# Patient Record
Sex: Female | Born: 1975 | Race: Black or African American | Hispanic: No | Marital: Married | Smoking: Never smoker
Health system: Southern US, Community
[De-identification: ages and names within clinical notes are randomized; demographics above are authoritative.]

## PROBLEM LIST (undated history)

## (undated) ENCOUNTER — Inpatient Hospital Stay (HOSPITAL_COMMUNITY): Payer: Self-pay

## (undated) DIAGNOSIS — A599 Trichomoniasis, unspecified: Secondary | ICD-10-CM

## (undated) DIAGNOSIS — Z8619 Personal history of other infectious and parasitic diseases: Secondary | ICD-10-CM

## (undated) DIAGNOSIS — IMO0002 Reserved for concepts with insufficient information to code with codable children: Secondary | ICD-10-CM

## (undated) DIAGNOSIS — K219 Gastro-esophageal reflux disease without esophagitis: Secondary | ICD-10-CM

## (undated) DIAGNOSIS — A630 Anogenital (venereal) warts: Secondary | ICD-10-CM

## (undated) HISTORY — DX: Personal history of other infectious and parasitic diseases: Z86.19

## (undated) HISTORY — DX: Trichomoniasis, unspecified: A59.9

## (undated) HISTORY — DX: Anogenital (venereal) warts: A63.0

## (undated) HISTORY — DX: Reserved for concepts with insufficient information to code with codable children: IMO0002

---

## 1999-10-07 ENCOUNTER — Other Ambulatory Visit: Admission: RE | Admit: 1999-10-07 | Discharge: 1999-10-07 | Payer: Self-pay | Admitting: Obstetrics and Gynecology

## 1999-11-05 ENCOUNTER — Other Ambulatory Visit: Admission: RE | Admit: 1999-11-05 | Discharge: 1999-11-05 | Payer: Self-pay | Admitting: *Deleted

## 2002-06-20 ENCOUNTER — Other Ambulatory Visit: Admission: RE | Admit: 2002-06-20 | Discharge: 2002-06-20 | Payer: Self-pay | Admitting: Obstetrics and Gynecology

## 2004-04-25 ENCOUNTER — Other Ambulatory Visit: Admission: RE | Admit: 2004-04-25 | Discharge: 2004-04-25 | Payer: Self-pay | Admitting: Obstetrics and Gynecology

## 2007-12-23 HISTORY — PX: OTHER SURGICAL HISTORY: SHX169

## 2009-12-22 DIAGNOSIS — R87619 Unspecified abnormal cytological findings in specimens from cervix uteri: Secondary | ICD-10-CM

## 2009-12-22 DIAGNOSIS — IMO0002 Reserved for concepts with insufficient information to code with codable children: Secondary | ICD-10-CM

## 2009-12-22 HISTORY — DX: Reserved for concepts with insufficient information to code with codable children: IMO0002

## 2009-12-22 HISTORY — DX: Unspecified abnormal cytological findings in specimens from cervix uteri: R87.619

## 2011-12-22 ENCOUNTER — Inpatient Hospital Stay (HOSPITAL_COMMUNITY): Admission: AD | Admit: 2011-12-22 | Payer: Self-pay | Source: Ambulatory Visit | Admitting: Obstetrics and Gynecology

## 2012-03-01 ENCOUNTER — Encounter (INDEPENDENT_AMBULATORY_CARE_PROVIDER_SITE_OTHER): Payer: Medicare HMO | Admitting: Obstetrics and Gynecology

## 2012-03-01 DIAGNOSIS — Z331 Pregnant state, incidental: Secondary | ICD-10-CM

## 2012-03-15 ENCOUNTER — Encounter (INDEPENDENT_AMBULATORY_CARE_PROVIDER_SITE_OTHER): Payer: Medicare HMO | Admitting: Obstetrics and Gynecology

## 2012-03-15 DIAGNOSIS — Z3480 Encounter for supervision of other normal pregnancy, unspecified trimester: Secondary | ICD-10-CM

## 2012-03-29 ENCOUNTER — Encounter (INDEPENDENT_AMBULATORY_CARE_PROVIDER_SITE_OTHER): Payer: Medicare HMO | Admitting: Obstetrics and Gynecology

## 2012-03-29 DIAGNOSIS — Z331 Pregnant state, incidental: Secondary | ICD-10-CM

## 2012-04-05 DIAGNOSIS — O09529 Supervision of elderly multigravida, unspecified trimester: Secondary | ICD-10-CM

## 2012-04-05 DIAGNOSIS — Z6837 Body mass index (BMI) 37.0-37.9, adult: Secondary | ICD-10-CM

## 2012-04-05 DIAGNOSIS — A491 Streptococcal infection, unspecified site: Secondary | ICD-10-CM

## 2012-04-06 ENCOUNTER — Ambulatory Visit (INDEPENDENT_AMBULATORY_CARE_PROVIDER_SITE_OTHER): Payer: Medicare HMO | Admitting: Obstetrics and Gynecology

## 2012-04-06 ENCOUNTER — Encounter: Payer: Self-pay | Admitting: Obstetrics and Gynecology

## 2012-04-06 VITALS — BP 106/64 | Ht 65.0 in | Wt 258.5 lb

## 2012-04-06 DIAGNOSIS — Z331 Pregnant state, incidental: Secondary | ICD-10-CM

## 2012-04-06 NOTE — Progress Notes (Signed)
Doing well. Cx: ftp/50%, -3. RTO 1 week. AVS

## 2012-04-13 ENCOUNTER — Ambulatory Visit (INDEPENDENT_AMBULATORY_CARE_PROVIDER_SITE_OTHER): Payer: Medicare HMO | Admitting: Obstetrics and Gynecology

## 2012-04-13 ENCOUNTER — Encounter: Payer: Self-pay | Admitting: Obstetrics and Gynecology

## 2012-04-13 VITALS — BP 114/72 | Ht 65.0 in | Wt 259.0 lb

## 2012-04-13 DIAGNOSIS — R000 Tachycardia, unspecified: Secondary | ICD-10-CM

## 2012-04-13 DIAGNOSIS — O368390 Maternal care for abnormalities of the fetal heart rate or rhythm, unspecified trimester, not applicable or unspecified: Secondary | ICD-10-CM

## 2012-04-13 DIAGNOSIS — Z331 Pregnant state, incidental: Secondary | ICD-10-CM

## 2012-04-13 NOTE — Progress Notes (Signed)
NST done for random FHR 170.  NST reactive.

## 2012-04-13 NOTE — Progress Notes (Signed)
Pt. Stated no issues today.  

## 2012-04-13 NOTE — Patient Instructions (Signed)
Continue fetal kick counts Labor precautions given

## 2012-04-21 ENCOUNTER — Encounter: Payer: Medicare HMO | Admitting: Obstetrics and Gynecology

## 2012-04-22 ENCOUNTER — Ambulatory Visit (INDEPENDENT_AMBULATORY_CARE_PROVIDER_SITE_OTHER): Payer: Medicare HMO | Admitting: Obstetrics and Gynecology

## 2012-04-22 ENCOUNTER — Telehealth (HOSPITAL_COMMUNITY): Payer: Self-pay | Admitting: *Deleted

## 2012-04-22 ENCOUNTER — Encounter: Payer: Self-pay | Admitting: Obstetrics and Gynecology

## 2012-04-22 VITALS — BP 112/72 | Wt 264.0 lb

## 2012-04-22 DIAGNOSIS — Z331 Pregnant state, incidental: Secondary | ICD-10-CM

## 2012-04-22 DIAGNOSIS — O09529 Supervision of elderly multigravida, unspecified trimester: Secondary | ICD-10-CM

## 2012-04-22 NOTE — Progress Notes (Signed)
No complaints

## 2012-04-22 NOTE — Progress Notes (Signed)
Pt. Stated no issues today the patient wants cervix check.

## 2012-04-26 ENCOUNTER — Encounter: Payer: Medicare HMO | Admitting: Obstetrics and Gynecology

## 2012-04-28 ENCOUNTER — Encounter: Payer: Self-pay | Admitting: Obstetrics and Gynecology

## 2012-04-28 ENCOUNTER — Ambulatory Visit (INDEPENDENT_AMBULATORY_CARE_PROVIDER_SITE_OTHER): Payer: Medicare HMO | Admitting: Obstetrics and Gynecology

## 2012-04-28 VITALS — BP 108/68 | Wt 267.0 lb

## 2012-04-28 DIAGNOSIS — Z331 Pregnant state, incidental: Secondary | ICD-10-CM

## 2012-04-28 NOTE — Progress Notes (Signed)
Pt c/o swelling in hands, feet, and lower legs.

## 2012-04-28 NOTE — Progress Notes (Signed)
A/P GBS positive Fetal kick counts reviewed Labor reviewed with pt All patients  questions answered 

## 2012-05-04 ENCOUNTER — Inpatient Hospital Stay (HOSPITAL_COMMUNITY)
Admission: AD | Admit: 2012-05-04 | Discharge: 2012-05-08 | DRG: 766 | Disposition: A | Discharge status: Home / Self Care | Payer: Managed Care, Other (non HMO) | Source: Ambulatory Visit | Attending: Obstetrics and Gynecology | Admitting: Obstetrics and Gynecology

## 2012-05-04 ENCOUNTER — Encounter (HOSPITAL_COMMUNITY): Payer: Self-pay | Admitting: *Deleted

## 2012-05-04 ENCOUNTER — Encounter: Payer: Medicare HMO | Admitting: Obstetrics and Gynecology

## 2012-05-04 DIAGNOSIS — Z905 Acquired absence of kidney: Secondary | ICD-10-CM

## 2012-05-04 DIAGNOSIS — O4290 Premature rupture of membranes, unspecified as to length of time between rupture and onset of labor, unspecified weeks of gestation: Secondary | ICD-10-CM | POA: Diagnosis present

## 2012-05-04 DIAGNOSIS — Z22330 Carrier of Group B streptococcus: Secondary | ICD-10-CM

## 2012-05-04 DIAGNOSIS — O368390 Maternal care for abnormalities of the fetal heart rate or rhythm, unspecified trimester, not applicable or unspecified: Secondary | ICD-10-CM | POA: Diagnosis not present

## 2012-05-04 DIAGNOSIS — Z98891 History of uterine scar from previous surgery: Secondary | ICD-10-CM | POA: Diagnosis not present

## 2012-05-04 DIAGNOSIS — O99892 Other specified diseases and conditions complicating childbirth: Secondary | ICD-10-CM | POA: Diagnosis present

## 2012-05-04 DIAGNOSIS — O09519 Supervision of elderly primigravida, unspecified trimester: Secondary | ICD-10-CM | POA: Diagnosis present

## 2012-05-04 LAB — CBC
HCT: 38.3 % (ref 36.0–46.0)
MCV: 87.8 fL (ref 78.0–100.0)
Platelets: 205 10*3/uL (ref 150–400)
RBC: 4.36 MIL/uL (ref 3.87–5.11)
WBC: 11.6 10*3/uL — ABNORMAL HIGH (ref 4.0–10.5)

## 2012-05-04 LAB — OB RESULTS CONSOLE HEPATITIS B SURFACE ANTIGEN: Hepatitis B Surface Ag: NEGATIVE

## 2012-05-04 LAB — OB RESULTS CONSOLE ABO/RH

## 2012-05-04 LAB — AMNISURE RUPTURE OF MEMBRANE (ROM) NOT AT ARMC: Amnisure ROM: POSITIVE

## 2012-05-04 LAB — OB RESULTS CONSOLE GC/CHLAMYDIA
Chlamydia: NEGATIVE
Gonorrhea: NEGATIVE

## 2012-05-04 LAB — OB RESULTS CONSOLE ANTIBODY SCREEN: Antibody Screen: NEGATIVE

## 2012-05-04 MED ORDER — LIDOCAINE HCL (PF) 1 % IJ SOLN: 30.0000 mL | INTRAMUSCULAR | Status: DC | PRN

## 2012-05-04 MED ORDER — OXYCODONE-ACETAMINOPHEN 5-325 MG PO TABS: 1.0000 | ORAL_TABLET | ORAL | Status: DC | PRN

## 2012-05-04 MED ORDER — SODIUM CHLORIDE 0.9 % IJ SOLN: 3.0000 mL | INTRAMUSCULAR | Status: DC | PRN

## 2012-05-04 MED ORDER — EPHEDRINE 5 MG/ML INJ: 10.0000 mg | INTRAVENOUS | Status: DC | PRN

## 2012-05-04 MED ORDER — HYDROXYZINE HCL 50 MG/ML IM SOLN: 50.0000 mg | Freq: Four times a day (QID) | INTRAMUSCULAR | Status: DC | PRN

## 2012-05-04 MED ORDER — OXYTOCIN 20 UNITS IN LACTATED RINGERS INFUSION - SIMPLE
1.0000 m[IU]/min | INTRAVENOUS | Status: DC
  Administered 2012-05-04: 2 m[IU]/min via INTRAVENOUS

## 2012-05-04 MED ORDER — FENTANYL 2.5 MCG/ML BUPIVACAINE 1/10 % EPIDURAL INFUSION (WH - ANES)
14.0000 mL/h | INTRAMUSCULAR | Status: DC
  Administered 2012-05-04 – 2012-05-05 (×2): 14 mL/h via EPIDURAL
  Filled 2012-05-04 (×3): qty 60

## 2012-05-04 MED ORDER — ONDANSETRON HCL 4 MG/2ML IJ SOLN: 4.0000 mg | Freq: Four times a day (QID) | INTRAMUSCULAR | Status: DC | PRN

## 2012-05-04 MED ORDER — PHENYLEPHRINE 40 MCG/ML (10ML) SYRINGE FOR IV PUSH (FOR BLOOD PRESSURE SUPPORT)
80.0000 ug | PREFILLED_SYRINGE | INTRAVENOUS | Status: DC | PRN
  Filled 2012-05-04: qty 2

## 2012-05-04 MED ORDER — PHENYLEPHRINE 40 MCG/ML (10ML) SYRINGE FOR IV PUSH (FOR BLOOD PRESSURE SUPPORT): 80.0000 ug | PREFILLED_SYRINGE | INTRAVENOUS | Status: DC | PRN

## 2012-05-04 MED ORDER — SODIUM CHLORIDE 0.9 % IJ SOLN: 3.0000 mL | Freq: Two times a day (BID) | INTRAMUSCULAR | Status: DC

## 2012-05-04 MED ORDER — FLEET ENEMA 7-19 GM/118ML RE ENEM: 1.0000 | ENEMA | RECTAL | Status: DC | PRN

## 2012-05-04 MED ORDER — ACETAMINOPHEN 325 MG PO TABS: 650.0000 mg | ORAL_TABLET | ORAL | Status: DC | PRN

## 2012-05-04 MED ORDER — HYDROXYZINE HCL 50 MG PO TABS: 50.0000 mg | ORAL_TABLET | Freq: Four times a day (QID) | ORAL | Status: DC | PRN

## 2012-05-04 MED ORDER — FENTANYL 2.5 MCG/ML BUPIVACAINE 1/10 % EPIDURAL INFUSION (WH - ANES): 14.0000 mL/h | INTRAMUSCULAR | Status: DC

## 2012-05-04 MED ORDER — PENICILLIN G POTASSIUM 5000000 UNITS IJ SOLR
5.0000 10*6.[IU] | Freq: Once | INTRAVENOUS | Status: AC
  Administered 2012-05-04: 5 10*6.[IU] via INTRAVENOUS
  Filled 2012-05-04: qty 5

## 2012-05-04 MED ORDER — OXYTOCIN 20 UNITS IN LACTATED RINGERS INFUSION - SIMPLE: 125.0000 mL/h | Freq: Once | INTRAVENOUS | Status: DC

## 2012-05-04 MED ORDER — SODIUM CHLORIDE 0.9 % IV SOLN: 250.0000 mL | INTRAVENOUS | Status: DC | PRN

## 2012-05-04 MED ORDER — LACTATED RINGERS IV SOLN
INTRAVENOUS | Status: DC
  Administered 2012-05-04: 300 mL/h via INTRAUTERINE

## 2012-05-04 MED ORDER — EPHEDRINE 5 MG/ML INJ
10.0000 mg | INTRAVENOUS | Status: DC | PRN
  Filled 2012-05-04: qty 2

## 2012-05-04 MED ORDER — EPHEDRINE 5 MG/ML INJ
10.0000 mg | INTRAVENOUS | Status: DC | PRN
  Filled 2012-05-04: qty 2
  Filled 2012-05-04: qty 4

## 2012-05-04 MED ORDER — FENTANYL 2.5 MCG/ML BUPIVACAINE 1/10 % EPIDURAL INFUSION (WH - ANES)
INTRAMUSCULAR | Status: DC | PRN
  Administered 2012-05-04: 14 mL/h via EPIDURAL

## 2012-05-04 MED ORDER — CITRIC ACID-SODIUM CITRATE 334-500 MG/5ML PO SOLN
30.0000 mL | ORAL | Status: DC | PRN
  Filled 2012-05-04: qty 15

## 2012-05-04 MED ORDER — PENICILLIN G POTASSIUM 5000000 UNITS IJ SOLR
2.5000 10*6.[IU] | INTRAMUSCULAR | Status: DC
  Administered 2012-05-04 (×5): 2.5 10*6.[IU] via INTRAVENOUS
  Filled 2012-05-04 (×9): qty 2.5

## 2012-05-04 MED ORDER — OXYTOCIN BOLUS FROM INFUSION
500.0000 mL | Freq: Once | INTRAVENOUS | Status: DC
  Filled 2012-05-04: qty 500
  Filled 2012-05-04: qty 1000

## 2012-05-04 MED ORDER — DIPHENHYDRAMINE HCL 50 MG/ML IJ SOLN: 12.5000 mg | INTRAMUSCULAR | Status: DC | PRN

## 2012-05-04 MED ORDER — LACTATED RINGERS IV SOLN
500.0000 mL | Freq: Once | INTRAVENOUS | Status: AC
  Administered 2012-05-04: 1000 mL via INTRAVENOUS
  Administered 2012-05-05: 04:00:00 via INTRAVENOUS

## 2012-05-04 MED ORDER — IBUPROFEN 600 MG PO TABS: 600.0000 mg | ORAL_TABLET | Freq: Four times a day (QID) | ORAL | Status: DC | PRN

## 2012-05-04 MED ORDER — BUTORPHANOL TARTRATE 2 MG/ML IJ SOLN: 1.0000 mg | INTRAMUSCULAR | Status: DC | PRN

## 2012-05-04 MED ORDER — LACTATED RINGERS IV SOLN: 500.0000 mL | Freq: Once | INTRAVENOUS | Status: DC

## 2012-05-04 MED ORDER — LIDOCAINE HCL (PF) 1 % IJ SOLN
INTRAMUSCULAR | Status: DC | PRN
  Administered 2012-05-04 (×2): 5 mL

## 2012-05-04 MED ORDER — PHENYLEPHRINE 40 MCG/ML (10ML) SYRINGE FOR IV PUSH (FOR BLOOD PRESSURE SUPPORT)
80.0000 ug | PREFILLED_SYRINGE | INTRAVENOUS | Status: DC | PRN
  Filled 2012-05-04: qty 5
  Filled 2012-05-04: qty 2

## 2012-05-04 MED ORDER — LACTATED RINGERS IV SOLN
INTRAVENOUS | Status: DC
  Administered 2012-05-04 – 2012-05-05 (×3): via INTRAVENOUS

## 2012-05-04 MED ORDER — LACTATED RINGERS IV SOLN
500.0000 mL | INTRAVENOUS | Status: DC | PRN
  Administered 2012-05-04: 500 mL via INTRAVENOUS

## 2012-05-04 MED ORDER — TERBUTALINE SULFATE 1 MG/ML IJ SOLN: 0.2500 mg | Freq: Once | INTRAMUSCULAR | Status: AC | PRN

## 2012-05-04 NOTE — Progress Notes (Signed)
Report received from A. Sherral Hammers, RN.  Hardie Shackleton, RN assume care.

## 2012-05-04 NOTE — Progress Notes (Signed)
  Subjective: Breathing with contractions, but declines need for pain medication at this time.    Objective: BP 135/85  Pulse 82  Temp(Src) 97.8 F (36.6 C) (Oral)  Resp 18  Ht 5\' 4"  (1.626 m)  Wt 269 lb (122.018 kg)  BMI 46.17 kg/m2  SpO2 100%      FHT:  Category 1--occasional mild variables UC:  q 4 min, mild/mod SVE:   Dilation: 2 Effacement (%): 90 Station: -2 Exam by:: [name]  Cervix still posterior, no membranes palpated.  Leaking clear fluid. Pitocin on 20 mu/min.  Assessment / Plan: PROM x 16 hours, latent phase labor Positive GBS  Plan: Continue current care.   Shelia Tucker 05/04/2012, 4:39 PM

## 2012-05-04 NOTE — Progress Notes (Signed)
RN to the bedside to assist patient to [facility] for void.

## 2012-05-04 NOTE — Anesthesia Preprocedure Evaluation (Addendum)
Anesthesia Evaluation  Patient identified by MRN, date of birth, ID band Patient awake    Reviewed: Allergy & Precautions, H&P , Patient's Chart, lab work & pertinent test results  Airway Mallampati: III TM Distance: >3 FB Neck ROM: full    Dental No notable dental hx.    Pulmonary neg pulmonary ROS,  breath sounds clear to auscultation  Pulmonary exam normal       Cardiovascular negative cardio ROS  Rhythm:regular Rate:Normal     Neuro/Psych negative neurological ROS  negative psych ROS   GI/Hepatic negative GI ROS, Neg liver ROS,   Endo/Other  negative endocrine ROS  Renal/GU negative Renal ROS     Musculoskeletal   Abdominal   Peds  Hematology negative hematology ROS (+)   Anesthesia Other Findings Abnormal Pap smear 2011 HPV positive Trichomonas        Condylomata acuminata in female     H/O candidiasis        History of varicella     Cystitis    Reproductive/Obstetrics (+) Pregnancy                           Anesthesia Physical Anesthesia Plan  ASA: III and Emergent  Anesthesia Plan: Epidural   Post-op Pain Management:    Induction:   Airway Management Planned:   Additional Equipment:   Intra-op Plan:   Post-operative Plan:   Informed Consent: I have reviewed the patients History and Physical, chart, labs and discussed the procedure including the risks, benefits and alternatives for the proposed anesthesia with the patient or authorized representative who has indicated his/her understanding and acceptance.     Plan Discussed with: CRNA, Anesthesiologist and Surgeon  Anesthesia Plan Comments:        Anesthesia Quick Evaluation

## 2012-05-04 NOTE — H&P (Signed)
Shelia Tucker is a 35y.o. Obese black female who presents for r/o ROM w/ CC of bloody d/c w/ watery consistency just before 2300 last night. No regular ctxs and no pain. C/o some pelvic "pressure." Has mainly seen d/c w/ wiping, but some superficially on a pad. No gushes of fluid. GFM.  Cx "about 1 cm" on her exam last week. No PIH or UTI s/s. No resp or GI c/o's.  Accompanied to MAU by her husband.  Prenatal Course: Pt started care at [facility] around 9 weeks; NOB urine cx did show positive GBS bacteriuria.  Her pregnancy has been overall uncomplicated.  She declined aneuploidy screens.  Had normal anatomy u/s w/ exception of a low-lying placenta at anatomy scan, but resolved on f/u at 22 weeks.  Did struggle w/ GERD during pregnancy, but treated w/ Zantac with good relief.  She did have increased weight gain during pregnancy beyond recommended amt for her elevated BMI.    Maternal Medical History:  Reason for admission: Reason for admission: rupture of membranes.  Contractions: Frequency: rare.    Fetal activity: Perceived fetal activity is normal.   Last perceived fetal movement was within the past hour.    Prenatal complications: 1.  AMA--declined genetic screens 2.  Unsure LMP--EDC by 9 week u/s 3.  GBS positive bacteriuria 4.  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  H/o low-lying placenta--resolved on f/u 6.  irreg cycles 7.  H/o abnl pap 8.  Remote h/o stds/condylomata     OB History    Grav Para Term Preterm Abortions TAB SAB Ect Mult Living   1 0 0 0 0 0 0 0 0 0      Past Medical History  Diagnosis Date  . 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 Trichomonas   . Condylomata acuminata in female   . H/O candidiasis   . History of varicella   . Cystitis    Past Surgical History  Procedure Date  . Donated kidney 2009   Family History: family history includes Heart disease in her maternal grandmother and Hypertension in her mother and paternal aunt. Social History:  reports  that she has never smoked. She has never used smokeless tobacco. 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  Her husband "Lyda Jester" has had some college and is involved and supportive.  Review of Systems  Constitutional: Negative.   HENT: Positive for congestion.        Nasal congestion and cough over this past weekend; improved since;   Eyes: Negative.   Respiratory: Positive for cough.        Nonproductive, mild cough over the weekend  Cardiovascular: Negative.   Gastrointestinal: Negative.   Genitourinary: Negative.   Skin: Negative.   Neurological: Negative.    Blood pressure 134/75, pulse 82, temperature 97.5 F (36.4 C), temperature source Oral, resp. rate 20, height 5\' 4"  (1.626 m), weight 269 lb (122.018 kg), SpO2 100.00%.   Maternal Exam:  Uterine Assessment: Contraction strength is mild.  Contraction frequency is rare.   Abdomen: Patient reports no abdominal tenderness. Estimated fetal weight is 7.5-8.5 lbs.   Fetal presentation: vertex  Introitus: Normal vulva. Vagina is positive for vaginal discharge.  Ferning test: negative.  Nitrazine test: not done. Amniotic fluid character: bloody.  Pelvis: adequate for delivery.   Cervix: Cervix evaluated by sterile speculum exam and digital exam.     Fetal Exam  Fetal Monitor Review: Mode: ultrasound.   Baseline rate: 135.  Variability: moderate (6-25 bpm).   Pattern: accelerations present and variable decelerations.    Fetal State Assessment: Category I - tracings are normal.     Physical Exam  Genitourinary: Vaginal discharge found.    Constitutional: She is oriented to person, place, and time. She appears well-developed and well-nourished. No distress.  Cardiovascular: Normal rate.  Respiratory: Effort normal.  GI: Soft.  gravid  Genitourinary:  SSE: Neg pooling; neg fern Mod amt of bloody show in vault (no active bleeding and not frank) Cx:  Ext os 1cm/ int os closed/30-40%/high/ant  Musculoskeletal: She exhibits edema.  Neurological: She is alert and oriented to person, place, and time.  Skin: Skin is warm and dry.   Prenatal labs: ABO, Rh: O/Positive/-- (05/14 0000) Antibody: Negative (05/14 0000) Rubella: Immune (05/14 0000) RPR: NON REACTIVE (05/14 0240)  HBsAg: Negative (05/14 0000)  HIV: Non-reactive (05/14 0000)  GBS: Positive (05/14 0000)  1hr gtt=69  Assessment/Plan: 1.  40.3 weeks 2.  PROM 3.  GBS pos 4.  amnisure positive 5.  Unfavorable cx  1.  Admit to [facility] w/ Dr. Estanislado Pandy as attending 2.  Rout L&D orders 3.  Start PCN-G now.  Offered expectant management vs Pitocin; R/B/A rev'd of each, and will watch for now, and if no regular ctxs by 7am, will begin pitocin low-dose per protocol 4.  C/w MD prn   Imogen Maddalena H 05/04/2012, 8:30 PM

## 2012-05-04 NOTE — Progress Notes (Signed)
  Subjective: Doing well--aware of contractions, some moderately painful.  Objective: BP 126/82  Pulse 77  Temp(Src) 98 F (36.7 C) (Oral)  Resp 18  Ht 5\' 4"  (1.626 m)  Wt 269 lb (122.018 kg)  BMI 46.17 kg/m2  SpO2 100%      FHT: Category 1--2 moderate variables with patient more on back, but tracing reactive before and after. UC:   q 3-4 min, pitocin on 18 mu/min  Assessment / Plan: PROM at 11:30pm 05/03/12 Augmentation with pitocin--early labor Postive GBS  Plan: Continue to observe.  [name] 05/04/2012, 2:50 PM

## 2012-05-04 NOTE — Progress Notes (Signed)
Called to report pt.'s status (labor progress); RN unable to trace contractions, pain level (1/10) from 16 mu of pitocin - instructed to continue increasing Pitocin - "we" will re-evaluate patient this evening. "Increase Pitocin as long as baby is OK." Asked pt. If she is feeling her contractions, pt. States, "I'm feeling a little cramping.  FOB remains at the bedside.

## 2012-05-04 NOTE — Progress Notes (Addendum)
Orders received to start amnio infusion and turn pitocin down to 15.

## 2012-05-04 NOTE — Anesthesia Procedure Notes (Signed)
Epidural Patient location during procedure: OB Start time: 05/04/2012 6:16 PM  Staffing Anesthesiologist: Brayton Caves R Performed by: anesthesiologist   Preanesthetic Checklist Completed: patient identified, site marked, surgical consent, pre-op evaluation, timeout performed, IV checked, risks and benefits discussed and monitors and equipment checked  Epidural Patient position: sitting Prep: site prepped and draped and DuraPrep Patient monitoring: continuous pulse ox and blood pressure Approach: midline Injection technique: LOR air and LOR saline  Needle:  Needle type: Tuohy  Needle gauge: 17 G Needle length: 9 cm Needle insertion depth: 9 cm Catheter type: closed end flexible Catheter size: 19 Gauge Catheter at skin depth: 15 cm Test dose: negative  Assessment Events: blood not aspirated, injection not painful, no injection resistance, negative IV test and no paresthesia  Additional Notes Patient identified.  Risk benefits discussed including failed block, incomplete pain control, headache, nerve damage, paralysis, blood pressure changes, nausea, vomiting, reactions to medication both toxic or allergic, and postpartum back pain.  Patient expressed understanding and wished to proceed.  All questions were answered.  Sterile technique used throughout procedure and epidural site dressed with sterile barrier dressing. No paresthesia or other complications noted.The patient did not experience any signs of intravascular injection such as tinnitus or metallic taste in mouth nor signs of intrathecal spread such as rapid motor block. Please see nursing notes for vital signs.

## 2012-05-04 NOTE — Progress Notes (Signed)
CNM on the phone and orders received to discontinue pitocin.

## 2012-05-04 NOTE — Progress Notes (Signed)
Subjective: Pt rechecked just before 2300, and although dilatation unchanged, fetal descent noted.  Pitocin is now off.  FHT still favors Rt sided positions.  Amnioinfusion continues.    Objective: BP 126/55  Pulse 66  Temp(Src) 98 F (36.7 C) (Oral)  Resp 18  Ht 5\' 4"  (1.626 m)  Wt 269 lb (122.018 kg)  BMI 46.17 kg/m2  SpO2 100%   Total I/O In: -  Out: 575 [Urine:575]  FHT:  FHR: 135 bpm, variability: moderate,  accelerations:  Abscent,  decelerations:  Present currently w/ recurring mild to mod variables UC:   irregular, every 2-5 minutes; some couplets SVE:   Dilation: 4 Effacement (%): 80 Station: -1 Exam by:: H. Deland Slocumb  Labs: Lab Results  Component Value Date   WBC 11.6* 05/04/2012   HGB 12.6 05/04/2012   HCT 38.3 05/04/2012   MCV 87.8 05/04/2012   PLT 205 05/04/2012    Assessment / Plan: 1.  40.3  2. PROM  3.  GBS pos  4.  Recurring decels since around 2100--stablized since Pitocin off and amnioinfusion started 5. Prolonged ROM  Labor: protracted latent phase Preeclampsia:  no signs or symptoms of toxicity Fetal Wellbeing:  Category II Pain Control:  Epidural I/D:  n/a Anticipated MOD:  NSVD 1.  Will cto FHT closely and for any s/s of infection 2.  MVU's are not adequate, so will try Pitocin prn if FHT WNL 3.  C/w MD prn Holden Draughon H 05/04/2012, 11:35 PM

## 2012-05-04 NOTE — Progress Notes (Signed)
  Subjective: Slept during night--aware of some mild cramping.  Leaking clear fluid.  Positive FM.  Objective: BP 120/76  Pulse 73  Temp(Src) 98 F (36.7 C) (Oral)  Resp 18  Ht 5\' 4"  (1.626 m)  Wt 269 lb (122.018 kg)  BMI 46.17 kg/m2  SpO2 100%      FHT:  Category 1 UC:   Occasional mild irritability SVE:   Dilation: 1 Effacement (%): 80 Station: -2 Exam by::  (Eriel Doyon) Cervix less posterior than previous exam by H. Steelman.  Labs: Lab Results  Component Value Date   WBC 11.6* 05/04/2012   HGB 12.6 05/04/2012   HCT 38.3 05/04/2012   MCV 87.8 05/04/2012   PLT 205 05/04/2012    Assessment / Plan: PROM prior to labor--SROM at 11:30pm 05/03/12. Positive GBS  Plan: Reviewed status with patient and husband, questions reviewed. Recommend pitocin augmentation--patient and husband agreeable. Discussed possibility of prolonged labor and need for further intervention (internal monitoring, C/S). Dr. Su Hilt updated.  [name] 05/04/2012, 8:46 AM

## 2012-05-04 NOTE — Progress Notes (Signed)
  Subjective: Doing well--comfortable with epidural.    Objective: BP 115/72  Pulse 84  Temp(Src) 98 F (36.7 C) (Oral)  Resp 18  Ht 5\' 4"  (1.626 m)  Wt 269 lb (122.018 kg)  BMI 46.17 kg/m2  SpO2 100%      FHT:  Category 1, occasional mild variable UC:   regular, every 3 minutes SVE:   Dilation: 3 Effacement (%): 90 Station: -2 Exam by:: Roselinda Bahena Cervix more anterior, forewaters noted. AROM--clear fluid. IUPC placed. Pitocin on 20 mu/min.   Assessment / Plan: PROM, positive GBS, augmentation with pitocin Early labor  Plan: Will continue to observe.   [name] 05/04/2012, 7:10 PM

## 2012-05-04 NOTE — Progress Notes (Signed)
Subjective: Pt comfortable.  Pitocin on 20mu.  NP friend and her husband remain at bedside.  RN reporting recent variables/lates; tried position changes and didn't tolerate Lt side very well.    Objective: BP 124/77  Pulse 71  Temp(Src) 98 F (36.7 C) (Oral)  Resp 20  Ht 5\' 4"  (1.626 m)  Wt 269 lb (122.018 kg)  BMI 46.17 kg/m2  SpO2 100%   Total I/O In: -  Out: 575 [Urine:575]  FHT:  FHR: 140 bpm, variability: moderate,  accelerations:  Present,  decelerations:  Present recurring variables/ some variables w/ late component since around 2100 UC:   regular, every 2-4 minutes some couplets SVE:   Dilation: 4 Effacement (%): 80 Station: -2 Exam by:: H. Bentlee Drier Mod bloody show noted; RN reports clear LOF (towel changed x2 since 1900) Labs: Lab Results  Component Value Date   WBC 11.6* 05/04/2012   HGB 12.6 05/04/2012   HCT 38.3 05/04/2012   MCV 87.8 05/04/2012   PLT 205 05/04/2012    Assessment / Plan: 1.  40.3  2.  PROM  3.  recurring decels last 1.5 hrs 4.  GBS pos; no s/s of infection  Labor: early active labor Preeclampsia:  no signs or symptoms of toxicity Fetal Wellbeing:  Category II Pain Control:  Epidural I/D:  n/a Anticipated MOD:  NSVD 1.  O2 on and decreased Pitocin in half to 10; start amnioinfusion 2.  C/w MD prn Mickaela Starlin H 05/04/2012, 10:25 PM

## 2012-05-04 NOTE — Progress Notes (Signed)
Patient ID: Shelia Tucker, female   DOB: May 18, 1976, 36 y.o.   MRN: [id number] .Subjective:  Denies any pain, occ mild cramping, had mucous bl tinged d/c recently  Objective: BP 87/65  Pulse 79  Temp(Src) 98 F (36.7 C) (Oral)  Resp 18  Ht 5\' 4"  (1.626 m)  Wt 269 lb (122.018 kg)  BMI 46.17 kg/m2  SpO2 100%   FHT:  FHR: 130 bpm, variability: moderate,  accelerations:  Present,  decelerations:  Present varibles just recently UC:   irregular, every 5-8 minutes SVE:   Dilation: 1 Effacement (%): 80 Station: -2 Exam by::  (latham)  Pitocin at 8mu  Assessment / Plan: IOL secondary to PROM GBS pos, has rcv'd PCN VE deferred   Fetal Wellbeing:  Category I Pain Control:  n/a  Update physician PRN  Malissa Hippo 05/04/2012, 10:42 AM

## 2012-05-04 NOTE — MAU Note (Signed)
Pt reports ?leaking fluid since 2330, fluid is slightly bloody per pt, states now it is just on the tissue when she wipes. Some mild cramping

## 2012-05-04 NOTE — Progress Notes (Signed)
Orders received to turn the pitocin down to 10 mu.

## 2012-05-05 ENCOUNTER — Encounter (HOSPITAL_COMMUNITY)
Admission: AD | Disposition: A | Discharge status: Home / Self Care | Payer: Self-pay | Source: Ambulatory Visit | Attending: Obstetrics and Gynecology

## 2012-05-05 ENCOUNTER — Encounter (HOSPITAL_COMMUNITY): Payer: Self-pay | Admitting: Anesthesiology

## 2012-05-05 ENCOUNTER — Inpatient Hospital Stay (HOSPITAL_COMMUNITY): Payer: Managed Care, Other (non HMO) | Admitting: Anesthesiology

## 2012-05-05 ENCOUNTER — Encounter (HOSPITAL_COMMUNITY): Payer: Self-pay | Admitting: Family Medicine

## 2012-05-05 SURGERY — Surgical Case
Anesthesia: Epidural

## 2012-05-05 MED ORDER — FENTANYL CITRATE 0.05 MG/ML IJ SOLN: 25.0000 ug | INTRAMUSCULAR | Status: DC | PRN

## 2012-05-05 MED ORDER — OXYTOCIN 10 UNIT/ML IJ SOLN
INTRAMUSCULAR | Status: DC | PRN
  Administered 2012-05-05: 20 [IU] via INTRAMUSCULAR

## 2012-05-05 MED ORDER — ONDANSETRON HCL 4 MG PO TABS: 4.0000 mg | ORAL_TABLET | ORAL | Status: DC | PRN

## 2012-05-05 MED ORDER — LACTATED RINGERS IV SOLN: INTRAVENOUS | Status: DC

## 2012-05-05 MED ORDER — IBUPROFEN 600 MG PO TABS: 600.0000 mg | ORAL_TABLET | Freq: Four times a day (QID) | ORAL | Status: DC | PRN

## 2012-05-05 MED ORDER — PHENYLEPHRINE HCL 10 MG/ML IJ SOLN
INTRAMUSCULAR | Status: DC | PRN
  Administered 2012-05-05: 120 ug via INTRAVENOUS
  Administered 2012-05-05 (×2): 40 ug via INTRAVENOUS
  Administered 2012-05-05 (×2): 80 ug via INTRAVENOUS

## 2012-05-05 MED ORDER — SODIUM CHLORIDE 0.9 % IV SOLN: 1.0000 ug/kg/h | INTRAVENOUS | Status: DC | PRN

## 2012-05-05 MED ORDER — MORPHINE SULFATE (PF) 0.5 MG/ML IJ SOLN
INTRAMUSCULAR | Status: DC | PRN
  Administered 2012-05-05: 4 mg via EPIDURAL

## 2012-05-05 MED ORDER — SODIUM BICARBONATE 8.4 % IV SOLN
INTRAVENOUS | Status: DC | PRN
  Administered 2012-05-05: 5 mL via EPIDURAL

## 2012-05-05 MED ORDER — SCOPOLAMINE 1 MG/3DAYS TD PT72
MEDICATED_PATCH | TRANSDERMAL | Status: AC
  Filled 2012-05-05: qty 1

## 2012-05-05 MED ORDER — WITCH HAZEL-GLYCERIN EX PADS: 1.0000 "application " | MEDICATED_PAD | CUTANEOUS | Status: DC | PRN

## 2012-05-05 MED ORDER — SODIUM CHLORIDE 0.9 % IJ SOLN: 3.0000 mL | INTRAMUSCULAR | Status: DC | PRN

## 2012-05-05 MED ORDER — MEPERIDINE HCL 25 MG/ML IJ SOLN: 6.2500 mg | INTRAMUSCULAR | Status: DC | PRN

## 2012-05-05 MED ORDER — ONDANSETRON HCL 4 MG/2ML IJ SOLN: 4.0000 mg | Freq: Three times a day (TID) | INTRAMUSCULAR | Status: DC | PRN

## 2012-05-05 MED ORDER — DIBUCAINE 1 % RE OINT
1.0000 "application " | TOPICAL_OINTMENT | RECTAL | Status: DC | PRN
  Filled 2012-05-05: qty 28

## 2012-05-05 MED ORDER — LANOLIN HYDROUS EX OINT: 1.0000 "application " | TOPICAL_OINTMENT | CUTANEOUS | Status: DC | PRN

## 2012-05-05 MED ORDER — ONDANSETRON HCL 4 MG/2ML IJ SOLN: 4.0000 mg | INTRAMUSCULAR | Status: DC | PRN

## 2012-05-05 MED ORDER — OXYCODONE-ACETAMINOPHEN 5-325 MG PO TABS: 1.0000 | ORAL_TABLET | ORAL | Status: DC | PRN

## 2012-05-05 MED ORDER — OXYTOCIN 10 UNIT/ML IJ SOLN
INTRAMUSCULAR | Status: AC
  Filled 2012-05-05: qty 2

## 2012-05-05 MED ORDER — DIPHENHYDRAMINE HCL 50 MG/ML IJ SOLN: 12.5000 mg | INTRAMUSCULAR | Status: DC | PRN

## 2012-05-05 MED ORDER — MORPHINE SULFATE (PF) 0.5 MG/ML IJ SOLN
INTRAMUSCULAR | Status: DC | PRN
  Administered 2012-05-05: .5 mg via EPIDURAL
  Administered 2012-05-05: .5 mg via INTRAVENOUS

## 2012-05-05 MED ORDER — MENTHOL 3 MG MT LOZG
1.0000 | LOZENGE | OROMUCOSAL | Status: DC | PRN
  Filled 2012-05-05: qty 9

## 2012-05-05 MED ORDER — NALBUPHINE HCL 10 MG/ML IJ SOLN
5.0000 mg | INTRAMUSCULAR | Status: DC | PRN
  Filled 2012-05-05: qty 1

## 2012-05-05 MED ORDER — ONDANSETRON HCL 4 MG/2ML IJ SOLN
INTRAMUSCULAR | Status: DC | PRN
  Administered 2012-05-05: 4 mg via INTRAVENOUS

## 2012-05-05 MED ORDER — METOCLOPRAMIDE HCL 5 MG/ML IJ SOLN
10.0000 mg | Freq: Three times a day (TID) | INTRAMUSCULAR | Status: DC | PRN
Start: 2012-05-05 — End: 2012-05-08

## 2012-05-05 MED ORDER — CEFAZOLIN SODIUM-DEXTROSE 2-3 GM-% IV SOLR
2.0000 g | Freq: Once | INTRAVENOUS | Status: AC
  Administered 2012-05-05: 2 g via INTRAVENOUS
  Filled 2012-05-05: qty 50

## 2012-05-05 MED ORDER — DIPHENHYDRAMINE HCL 25 MG PO CAPS
25.0000 mg | ORAL_CAPSULE | ORAL | Status: DC | PRN
  Filled 2012-05-05: qty 1

## 2012-05-05 MED ORDER — SENNOSIDES-DOCUSATE SODIUM 8.6-50 MG PO TABS
2.0000 | ORAL_TABLET | Freq: Every day | ORAL | Status: DC
  Administered 2012-05-06 – 2012-05-07 (×2): 2 via ORAL

## 2012-05-05 MED ORDER — PHENYLEPHRINE 40 MCG/ML (10ML) SYRINGE FOR IV PUSH (FOR BLOOD PRESSURE SUPPORT)
PREFILLED_SYRINGE | INTRAVENOUS | Status: AC
  Filled 2012-05-05: qty 10

## 2012-05-05 MED ORDER — SCOPOLAMINE 1 MG/3DAYS TD PT72
1.0000 | MEDICATED_PATCH | Freq: Once | TRANSDERMAL | Status: AC
  Administered 2012-05-05: 1.5 mg via TRANSDERMAL

## 2012-05-05 MED ORDER — DIPHENHYDRAMINE HCL 50 MG/ML IJ SOLN: 25.0000 mg | INTRAMUSCULAR | Status: DC | PRN

## 2012-05-05 MED ORDER — KETOROLAC TROMETHAMINE 30 MG/ML IJ SOLN: 30.0000 mg | Freq: Four times a day (QID) | INTRAMUSCULAR | Status: AC | PRN

## 2012-05-05 MED ORDER — ZOLPIDEM TARTRATE 5 MG PO TABS: 5.0000 mg | ORAL_TABLET | Freq: Every evening | ORAL | Status: DC | PRN

## 2012-05-05 MED ORDER — ONDANSETRON HCL 4 MG/2ML IJ SOLN
INTRAMUSCULAR | Status: AC
  Filled 2012-05-05: qty 2

## 2012-05-05 MED ORDER — DIPHENHYDRAMINE HCL 25 MG PO CAPS
25.0000 mg | ORAL_CAPSULE | Freq: Four times a day (QID) | ORAL | Status: DC | PRN
  Filled 2012-05-05: qty 1

## 2012-05-05 MED ORDER — KETOROLAC TROMETHAMINE 30 MG/ML IJ SOLN
INTRAMUSCULAR | Status: AC
  Filled 2012-05-05: qty 1

## 2012-05-05 MED ORDER — OXYTOCIN 20 UNITS IN LACTATED RINGERS INFUSION - SIMPLE
125.0000 mL/h | INTRAVENOUS | Status: AC
  Administered 2012-05-05: 125 mL/h via INTRAVENOUS
  Filled 2012-05-05: qty 1000

## 2012-05-05 MED ORDER — BUPIVACAINE HCL (PF) 0.25 % IJ SOLN: INTRAMUSCULAR | Status: DC | PRN

## 2012-05-05 MED ORDER — IBUPROFEN 600 MG PO TABS
600.0000 mg | ORAL_TABLET | Freq: Four times a day (QID) | ORAL | Status: DC
  Administered 2012-05-05 – 2012-05-08 (×10): 600 mg via ORAL
  Filled 2012-05-05 (×13): qty 1

## 2012-05-05 MED ORDER — TETANUS-DIPHTH-ACELL PERTUSSIS 5-2.5-18.5 LF-MCG/0.5 IM SUSP
0.5000 mL | Freq: Once | INTRAMUSCULAR | Status: AC
  Administered 2012-05-06: 0.5 mL via INTRAMUSCULAR
  Filled 2012-05-05 (×2): qty 0.5

## 2012-05-05 MED ORDER — SIMETHICONE 80 MG PO CHEW
80.0000 mg | CHEWABLE_TABLET | Freq: Three times a day (TID) | ORAL | Status: DC
  Administered 2012-05-05 – 2012-05-08 (×7): 80 mg via ORAL

## 2012-05-05 MED ORDER — NALOXONE HCL 0.4 MG/ML IJ SOLN: 0.4000 mg | INTRAMUSCULAR | Status: DC | PRN

## 2012-05-05 MED ORDER — SIMETHICONE 80 MG PO CHEW
80.0000 mg | CHEWABLE_TABLET | ORAL | Status: DC | PRN
  Administered 2012-05-06 (×2): 80 mg via ORAL

## 2012-05-05 MED ORDER — PRENATAL MULTIVITAMIN CH
1.0000 | ORAL_TABLET | Freq: Every day | ORAL | Status: DC
  Administered 2012-05-06 – 2012-05-08 (×3): 1 via ORAL
  Filled 2012-05-05 (×5): qty 1

## 2012-05-05 MED ORDER — LIDOCAINE-EPINEPHRINE (PF) 2 %-1:200000 IJ SOLN
INTRAMUSCULAR | Status: AC
  Filled 2012-05-05: qty 20

## 2012-05-05 MED ORDER — SODIUM BICARBONATE 8.4 % IV SOLN
INTRAVENOUS | Status: AC
  Filled 2012-05-05: qty 50

## 2012-05-05 MED ORDER — MORPHINE SULFATE 0.5 MG/ML IJ SOLN
INTRAMUSCULAR | Status: AC
  Filled 2012-05-05: qty 10

## 2012-05-05 MED ORDER — PHENYLEPHRINE 40 MCG/ML (10ML) SYRINGE FOR IV PUSH (FOR BLOOD PRESSURE SUPPORT)
PREFILLED_SYRINGE | INTRAVENOUS | Status: AC
  Filled 2012-05-05: qty 5

## 2012-05-05 MED ORDER — KETOROLAC TROMETHAMINE 30 MG/ML IJ SOLN
30.0000 mg | Freq: Four times a day (QID) | INTRAMUSCULAR | Status: AC | PRN
  Administered 2012-05-05: 30 mg via INTRAVENOUS

## 2012-05-05 SURGICAL SUPPLY — 34 items
BENZOIN TINCTURE PRP APPL 2/3 (GAUZE/BANDAGES/DRESSINGS) ×2 IMPLANT
CHLORAPREP W/TINT 26ML (MISCELLANEOUS) ×2 IMPLANT
CLOTH BEACON ORANGE TIMEOUT ST (SAFETY) ×2 IMPLANT
CONTAINER PREFILL 10% NBF 15ML (MISCELLANEOUS) IMPLANT
DRSG COVADERM 4X10 (GAUZE/BANDAGES/DRESSINGS) ×2 IMPLANT
ELECT REM PT RETURN 9FT ADLT (ELECTROSURGICAL) ×2
ELECTRODE REM PT RTRN 9FT ADLT (ELECTROSURGICAL) ×1 IMPLANT
EXTRACTOR VACUUM M CUP 4 TUBE (SUCTIONS) IMPLANT
GLOVE BIO SURGEON STRL SZ7.5 (GLOVE) ×4 IMPLANT
GLOVE BIOGEL PI IND STRL 7.5 (GLOVE) ×1 IMPLANT
GLOVE BIOGEL PI INDICATOR 7.5 (GLOVE) ×1
GLOVE ECLIPSE 7.5 STRL STRAW (GLOVE) ×2 IMPLANT
GLOVE SURG SS PI 7.5 STRL IVOR (GLOVE) ×4 IMPLANT
GOWN PREVENTION PLUS LG XLONG (DISPOSABLE) ×4 IMPLANT
KIT ABG SYR 3ML LUER SLIP (SYRINGE) IMPLANT
NEEDLE HYPO 22GX1.5 SAFETY (NEEDLE) IMPLANT
NEEDLE HYPO 25X5/8 SAFETYGLIDE (NEEDLE) IMPLANT
NS IRRIG 1000ML POUR BTL (IV SOLUTION) ×2 IMPLANT
PACK C SECTION WH (CUSTOM PROCEDURE TRAY) ×2 IMPLANT
RETRACTOR WND ALEXIS 25 LRG (MISCELLANEOUS) ×1 IMPLANT
RTRCTR WOUND ALEXIS 25CM LRG (MISCELLANEOUS) ×2
SLEEVE SCD COMPRESS KNEE MED (MISCELLANEOUS) IMPLANT
STRIP CLOSURE SKIN 1/2X4 (GAUZE/BANDAGES/DRESSINGS) ×2 IMPLANT
SUT CHROMIC 2 0 CT 1 (SUTURE) ×2 IMPLANT
SUT MNCRL AB 3-0 PS2 27 (SUTURE) ×2 IMPLANT
SUT PLAIN 0 NONE (SUTURE) IMPLANT
SUT PLAIN 2 0 XLH (SUTURE) ×2 IMPLANT
SUT VIC AB 0 CT1 36 (SUTURE) ×2 IMPLANT
SUT VIC AB 0 CTX 36 (SUTURE) ×4
SUT VIC AB 0 CTX36XBRD ANBCTRL (SUTURE) ×4 IMPLANT
SYR CONTROL 10ML LL (SYRINGE) IMPLANT
TOWEL OR 17X24 6PK STRL BLUE (TOWEL DISPOSABLE) ×4 IMPLANT
TRAY FOLEY CATH 14FR (SET/KITS/TRAYS/PACK) ×2 IMPLANT
WATER STERILE IRR 1000ML POUR (IV SOLUTION) ×2 IMPLANT

## 2012-05-05 NOTE — Progress Notes (Addendum)
Dr. Su Hilt and Eustace Pen at the bedside to discuss the risks and benefits of caesarean section delivery, pt states understanding. Informed consents singed.

## 2012-05-05 NOTE — Progress Notes (Signed)
Delivery call to OR 2.

## 2012-05-05 NOTE — Addendum Note (Signed)
Addendum  created 05/05/12 0940 by Shanon Payor, CRNA   Modules edited:Notes Section

## 2012-05-05 NOTE — Progress Notes (Signed)
Subjective: Called to pt's bedside for decelerations at 0211.  Pitocin restarted at 0045 at 1mu, and RN had discontinued again around the time she called me to bedside.  Pt has had oxygen on.  RN rechecked cx and no change.    Objective: BP 122/66  Pulse 77  Temp(Src) 97.8 F (36.6 C) (Axillary)  Resp 20  Ht 5\' 4"  (1.626 m)  Wt 269 lb (122.018 kg)  BMI 46.17 kg/m2  SpO2 100%   Total I/O In: -  Out: 575 [Urine:575]  FHT:  FHR: 135 bpm, variability: moderate,  accelerations:  Abscent,  decelerations:  Present recurrent mild to mod variables despite interventions UC:   regular, every 2-3 minutes; ctxs had spaced while pitocin off to q 2-5 min SVE:   Dilation: 3.5 Effacement (%): 80 Station: -1 Exam by:: Everrett Coombe RN  Labs: Lab Results  Component Value Date   WBC 11.6* 05/04/2012   HGB 12.6 05/04/2012   HCT 38.3 05/04/2012   MCV 87.8 05/04/2012   PLT 205 05/04/2012    Assessment / Plan: 1.  40.4  2.  Prolonged ROM  3.  GBS pos  4.  No cervical change, but inadequate MVU's 5.  Recurrent decels  Labor: protracted latent phase Preeclampsia:  no signs or symptoms of toxicity Fetal Wellbeing:  Category II Pain Control:  Epidural I/D:  n/a Anticipated MOD:  Probable need for c/s discussed with pt and her s.o. secondary to NR FHT and inability to titrate Pitocin to achieve adequacy b/c of FHT 1.  Dr. Su Hilt notified 2.  Will continue to observe closely, and if no change over next hr, will proceed w/ c/s for delivery Aaran Enberg H 05/05/2012, 2:54 AM

## 2012-05-05 NOTE — Transfer of Care (Signed)
Immediate Anesthesia Transfer of Care Note  Patient: Shelia Tucker  Procedure(s) Performed: Procedure(s) (LRB): CESAREAN SECTION (N/A)  Patient Location: PACU  Anesthesia Type: Epidural  Level of Consciousness: sedated and patient cooperative  Airway & Oxygen Therapy: Patient Spontanous Breathing  Post-op Assessment: Report given to PACU RN and Post -op Vital signs reviewed and stable  Post vital signs: Reviewed and stable  Complications: No apparent anesthesia complications

## 2012-05-05 NOTE — Anesthesia Postprocedure Evaluation (Signed)
  Anesthesia Post-op Note  Patient: Shelia Tucker, Shelia Tucker  Procedure(s) Performed: Procedure(s) (LRB): CESAREAN SECTION (N/A)  Patient Location: PACU  Anesthesia Type: Epidural  Level of Consciousness: awake, alert  and oriented  Airway and Oxygen Therapy: Patient Spontanous Breathing  Post-op Pain: none  Post-op Assessment: Post-op Vital signs reviewed, Patient's Cardiovascular Status Stable, Respiratory Function Stable, Patent Airway, No signs of Nausea or vomiting, Pain level controlled, No headache and No backache  Post-op Vital Signs: Reviewed and stable  Complications: No apparent anesthesia complications

## 2012-05-05 NOTE — Op Note (Signed)
Cesarean Section Procedure Note  Indications: 1.Term IUP 2.NRFHT unable to tolerate pitocin  Pre-operative Diagnosis: Non-Reassuring Fetal Heart Rate   Post-operative Diagnosis: Non-Reassuring Fetal Heart Rate  Procedure: CESAREAN SECTION  Surgeon: Osborn Coho, MD   Assistants: Denny Levy, CNM  Anesthesia: Regional  Anesthesiologist: Tyrone Apple. Malen Gauze, MD   Procedure Details  The patient was taken to the operating room secondary to NRFHT after the risks, benefits, complications, treatment options, and expected outcomes were discussed with the patient.  The patient concurred with the proposed plan, giving informed consent which was signed and witnessed. The patient was taken to Operating Room 2, identified as Shelia Tucker and the procedure verified as C-Section Delivery. A Time Out was held and the above information confirmed.  After induction of anesthesia by obtained a surgical level via the epidural, the patient was prepped and draped in the usual sterile manner. A Pfannenstiel skin incision was made and carried down through the subcutaneous tissue to the underlying layer of fascia.  The fascia was incised bilaterally and extended transversely bilaterally with the Mayo scissors. Kocher clamps were placed on the inferior aspect of the fascial incision and the underlying rectus muscle was separated from the fascia. The same was done on the superior aspect of the fascial incision.  The peritoneum was identified, entered bluntly and extended manually. The utero-vesical peritoneal reflection was incised transversely and the bladder flap was bluntly freed from the lower uterine segment. A low transverse uterine incision was made with the scalpel and extended bilaterally with the bandage scissors.  The infant was delivered in vertex position without difficulty.  After the umbilical cord was clamped and cut, the infant was handed to the awaiting pediatricians.  Cord blood was obtained for  evaluation.  The placenta was removed intact and appeared to be within normal limits. The uterus was cleared of all clots and debris. The uterine incision was closed with running interlocking sutures of 0 Vicryl and a second imbricating layer was performed as well.   Bilateral tubes and ovaries appeared to be within normal limits.  Good hemostasis was noted.  Copious irrigation was performed until clear.  The peritoneum was repaired with 2-0 chromic via a running suture.  The fascia was reapproximated with a running suture of 0 Vicryl. The subcutaneous tissue was reapproximated with 3 interrupted sutures of 2-0 plain.  The skin was reapproximated with a subcuticular suture of 3-0 monocryl.  Steristrips were applied.  Instrument, sponge, and needle counts were correct prior to abdominal closure and at the conclusion of the case.  The patient was awaiting transfer to the recovery room in good condition.  Findings: Live female infant with Apgars 9 at one minute and 9 at five minutes.  Normal appearing bilateral ovaries and fallopian tubes were noted.   Estimated Blood Loss:          Drains: foley to gravity         Total IV Fluids:         Specimens to Pathology: Placenta         Complications:  None; patient tolerated the procedure well.         Disposition: PACU - hemodynamically stable.         Condition: stable  Attending Attestation: I performed the procedure.

## 2012-05-05 NOTE — OR Nursing (Deleted)
Unable to obtain cord blood per Dr. Su Hilt

## 2012-05-05 NOTE — Anesthesia Postprocedure Evaluation (Signed)
  Anesthesia Post-op Note  Patient: Designer, industrial/product  Procedure(s) Performed: Procedure(s) (LRB): CESAREAN SECTION (N/A)  Patient Location: Mother/Baby  Anesthesia Type: Epidural  Level of Consciousness: awake, alert  and oriented  Airway and Oxygen Therapy: Patient Spontanous Breathing  Post-op Pain: none  Post-op Assessment: Post-op Vital signs reviewed, Patient's Cardiovascular Status Stable, No headache, No backache, No residual numbness and No residual motor weakness  Post-op Vital Signs: Reviewed and stable  Complications: No apparent anesthesia complications

## 2012-05-06 ENCOUNTER — Encounter (HOSPITAL_COMMUNITY): Payer: Self-pay | Admitting: Obstetrics and Gynecology

## 2012-05-06 LAB — CBC
Platelets: 175 10*3/uL (ref 150–400)
RDW: 14.3 % (ref 11.5–15.5)
WBC: 15.2 10*3/uL — ABNORMAL HIGH (ref 4.0–10.5)

## 2012-05-06 NOTE — Progress Notes (Signed)
Patient ID: Shelia Tucker, female   DOB: Sep 03, 1976, 36 y.o.   MRN: [id number] Subjective: Postpartum Day 1: Cesarean Delivery Patient reports tolerating PO and no problems voiding. No BM, no flatus  no complaints, up ad lib without syncope Pain well controlled with po meds BF initiated, baby fussy Mood stable, bonding well   Objective: Vital signs in last 24 hours: Temp:  [97 F (36.1 C)-98.7 F (37.1 C)] 98.3 F (36.8 C) (05/16 0603) Pulse Rate:  [61-80] 66  (05/16 0603) Resp:  [13-18] 18  (05/16 0603) BP: (102-148)/(65-96) 107/69 mmHg (05/16 0603) SpO2:  [98 %-100 %] 99 % (05/16 0345)  Physical Exam:  General: alert and no distress Breasts: soft, nipples intact Heart: RRR Lungs: CTAB Abdomen: BS x4 Uterine Fundus: firm Incision: CDI Lochia: appropriate DVT Evaluation: No evidence of DVT seen on physical exam. Negative Homan's sign. Calf/Ankle edema is present.   Basename 05/06/12 0500 05/04/12 0240  HGB 9.3* 12.6  HCT 28.8* 38.3    Assessment/Plan: Status post Cesarean section. Doing well postoperatively.  Anemia, hemodynamically stable Mild leukocytosis, no s/s infection Continue current care.  Nattalie Santiesteban M 05/06/2012, 8:22 AM

## 2012-05-07 ENCOUNTER — Encounter: Payer: Medicare HMO | Admitting: Obstetrics and Gynecology

## 2012-05-07 NOTE — Progress Notes (Signed)
Subjective: Postpartum Day 2Cesarean Delivery Patient reports no  nausea, vomiting, tolerating PO, + flatus and no problems voiding.   BP 114/78  Pulse 68  Temp(Src) 98.3 F (36.8 C) (Oral)  Resp 18  Ht 5\' 4"  (1.626 m)  Wt 269 lb (122.018 kg)  BMI 46.17 kg/m2  SpO2 99%  Breastfeeding? Unknown  Physical Exam:  General: alert, cooperative and no distress Lochia: appropriate Uterine Fundus: firm Incision: healing well, no significant drainage, no significant erythema DVT Evaluation: Negative Homan's sign. No significant calf/ankle edema. Lungs clear bilaterally AP RRR Bowel sounds active abd soft rounded nt,  S: comfortable, little bleeding, slept     feeding A normal involution     Lactating     PO day 2     Normal involution P continue care Lavera Guise, CNM

## 2012-05-08 DIAGNOSIS — Z98891 History of uterine scar from previous surgery: Secondary | ICD-10-CM | POA: Diagnosis not present

## 2012-05-08 MED ORDER — IBUPROFEN 600 MG PO TABS: 600.0000 mg | ORAL_TABLET | Freq: Four times a day (QID) | ORAL | Status: AC | PRN

## 2012-05-08 MED ORDER — OXYCODONE-ACETAMINOPHEN 5-325 MG PO TABS: 1.0000 | ORAL_TABLET | ORAL | Status: AC | PRN

## 2012-05-08 NOTE — Discharge Instructions (Signed)
Cesarean Delivery  °Cesarean delivery is the birth of a baby through a cut (incision) in the abdomen and womb (uterus).  °LET YOUR CAREGIVER KNOW ABOUT: °· Complications involving the pregnancy.  °· Allergies.  °· Medicines taken including herbs, eyedrops, over-the-counter medicines, and creams.  °· Use of steroids (by mouth or creams).  °· Previous problems with anesthetics or numbing medicine.  °· Previous surgery.  °· History of blood clots.  °· History of bleeding or blood problems.  °· Other health problems.  °RISKS AND COMPLICATIONS  °· Bleeding.  °· Infection.  °· Blood clots.  °· Injury to surrounding organs.  °· Anesthesia problems.  °· Injury to the baby.  °BEFORE THE PROCEDURE  °· A tube (Foley catheter) will be placed in your bladder. The Foley catheter drains the urine from your bladder into a bag. This keeps your bladder empty during surgery.  °· An intravenous access tube (IV) will be placed in your arm.  °· Hair may be removed from your pubic area and your lower abdomen. This is to prevent infection in the incision site.  °· You may be given an antacid medicine to drink. This will prevent acid contents in your stomach from going into your lungs if you vomit during the surgery.  °· You may be given an antibiotic medicine to prevent infection.  °PROCEDURE  °· You may be given medicine to numb the lower half of your body (regional anesthetic). If you were in labor, you may have already had an epidural in place which can be used in both labor and cesarean delivery. You may possibly be given medicine to make you sleep (general anesthetic) though this is not as common.  °· An incision will be made in your abdomen that extends to your uterus. There are 2 basic kinds of incisions:  °· The horizontal (transverse) incision. Horizontal incisions are used for most routine cesarean deliveries.  °· The vertical (up and down) incision. This is less commonly used. This is most often reserved for women who have a  serious complication (extreme prematurity) or under emergency situations.   °· The horizontal and vertical incisions may both be used at the same time. However, this is very uncommon.  °· Your baby will then be delivered.  °AFTER THE PROCEDURE  °· If you were awake during the surgery, you will see your baby right away. If you were asleep, you will see your baby as soon as you are awake.  °· You may breastfeed your baby after surgery.  °· You may be able to get up and walk the same day as the surgery. If you need to stay in bed for a period of time, you will receive help to turn, cough, and take deep breaths after surgery. This helps prevent lung problems such as pneumonia.  °· Do not get out of bed alone the first time after surgery. You will need help getting out of bed until you are able to do this by yourself.  °· You may be able to shower the day after your cesarean delivery.  After the bandage (dressing) is taken off the incision site, a nurse will assist you to shower, if you like.   °· You will have pneumatic compressing hose placed on your feet or lower legs. These hose are used to prevent blood clots. When you are up and walking regularly, they will no longer be necessary.   °· Do not cross your legs when you sit.  °· Save any blood clots that you   pass. If you pass a clot while on the toilet, do not flush it. Call for the nurse. Tell the nurse if you think you are bleeding too much or passing too many clots.   Start drinking liquids and eating food as directed by your caregiver. If your stomach is not ready, drinking and eating too soon can cause an increase in bloating and swelling of your intestine and abdomen. This is very uncomfortable.   You will be given medicine as needed. Let your caregivers know if you are hurting. They want you to be comfortable. You may also be given an antibiotic to prevent an infection.   Your IV will be taken out when you are drinking a reasonable amount of fluids. The  Foley catheter is taken out when you are up and walking.   If your blood type is Rh negative and your baby's blood type is Rh positive, you will be given a shot of anti-D immune globulin. This shot prevents you from having Rh problems with a future pregnancy. You should get the shot even if you had your tubes tied (tubal ligation).   If you are allowed to take the baby for a walk, place the baby in the bassinet and push it. Do not carry your baby in your arms.  Document Released: 12/08/2005 Document Revised: 11/27/2011 Document Reviewed: 04/04/2011 [facility] Patient Information 2012 [place], [place]. Breastfeeding BENEFITS OF BREASTFEEDING For the baby  The first milk (colostrum) helps the baby's digestive system function better.   There are antibodies from the mother in the milk that help the baby fight off infections.   The baby has a lower incidence of asthma, allergies, and SIDS (sudden infant death syndrome).   The nutrients in breast milk are better than formulas for the baby and helps the baby's brain grow better.   Babies who breastfeed have less gas, colic, and constipation.  For the mother  Breastfeeding helps develop a very special bond between mother and baby.   It is more convenient, always available at the correct temperature and cheaper than formula feeding.   It burns calories in the mother and helps with losing weight that was gained during pregnancy.   It makes the uterus contract back down to normal size faster and slows bleeding following delivery.   Breastfeeding mothers have a lower risk of developing breast cancer.  NURSE FREQUENTLY  A healthy, full-term baby may breastfeed as often as every hour or space his or her feedings to every 3 hours.   How often to nurse will vary from baby to baby. Watch your baby for signs of hunger, not the clock.   Nurse as often as the baby requests, or when you feel the need to reduce the fullness of your breasts.   Awaken  the baby if it has been 3 to 4 hours since the last feeding.   Frequent feeding will help the mother make more milk and will prevent problems like sore nipples and engorgement of the breasts.  BABY'S POSITION AT THE BREAST  Whether lying down or sitting, be sure that the baby's tummy is facing your tummy.   Support the breast with 4 fingers underneath the breast and the thumb above. Make sure your fingers are well away from the nipple and baby's mouth.   Stroke the baby's lips and cheek closest to the breast gently with your finger or nipple.   When the baby's mouth is open wide enough, place all of your nipple and as  much of the dark area around the nipple as possible into your baby's mouth.   Pull the baby in close so the tip of the nose and the baby's cheeks touch the breast during the feeding.  FEEDINGS  The length of each feeding varies from baby to baby and from feeding to feeding.   The baby must suck about 2 to 3 minutes for your milk to get to him or her. This is called a "let down." For this reason, allow the baby to feed on each breast as long as he or she wants. Your baby will end the feeding when he or she has received the right balance of nutrients.   To break the suction, put your finger into the corner of the baby's mouth and slide it between his or her gums before removing your breast from his or her mouth. This will help prevent sore nipples.  REDUCING BREAST ENGORGEMENT  In the first week after your baby is born, you may experience signs of breast engorgement. When breasts are engorged, they feel heavy, warm, full, and may be tender to the touch. You can reduce engorgement if you:   Nurse frequently, every 2 to 3 hours. Mothers who breastfeed early and often have fewer problems with engorgement.   Place light ice packs on your breasts between feedings. This reduces swelling. Wrap the ice packs in a lightweight towel to protect your skin.   Apply moist hot packs to your  breast for 5 to 10 minutes before each feeding. This increases circulation and helps the milk flow.   Gently massage your breast before and during the feeding.   Make sure that the baby empties at least one breast at every feeding before switching sides.   Use a breast pump to empty the breasts if your baby is sleepy or not nursing well. You may also want to pump if you are returning to work or or you feel you are getting engorged.   Avoid bottle feeds, pacifiers or supplemental feedings of water or juice in place of breastfeeding.   Be sure the baby is latched on and positioned properly while breastfeeding.   Prevent fatigue, stress, and anemia.   Wear a supportive bra, avoiding underwire styles.   Eat a balanced diet with enough fluids.  If you follow these suggestions, your engorgement should improve in 24 to 48 hours. If you are still experiencing difficulty, call your lactation consultant or caregiver. IS MY BABY GETTING ENOUGH MILK? Sometimes, mothers worry about whether their babies are getting enough milk. You can be assured that your baby is getting enough milk if:  The baby is actively sucking and you hear swallowing.   The baby nurses at least 8 to 12 times in a 24 hour time period. Nurse your baby until he or she unlatches or falls asleep at the first breast (at least 10 to 20 minutes), then offer the second side.   The baby is wetting 5 to 6 disposable diapers (6 to 8 cloth diapers) in a 24 hour period by 93 to 54 days of age.   The baby is having at least 2 to 3 stools every 24 hours for the first few months. Breast milk is all the food your baby needs. It is not necessary for your baby to have water or formula. In fact, to help your breasts make more milk, it is best not to give your baby supplemental feedings during the early weeks.   The stool should  be soft and yellow.   The baby should gain 4 to 7 ounces per week after he is 87 days old.  TAKE CARE OF YOURSELF Take  care of your breasts by:  Bathing or showering daily.   Avoiding the use of soaps on your nipples.   Start feedings on your left breast at one feeding and on your right breast at the next feeding.   You will notice an increase in your milk supply 2 to 5 days after delivery. You may feel some discomfort from engorgement, which makes your breasts very firm and often tender. Engorgement "peaks" out within 24 to 48 hours. In the meantime, apply warm moist towels to your breasts for 5 to 10 minutes before feeding. Gentle massage and expression of some milk before feeding will soften your breasts, making it easier for your baby to latch on. Wear a well fitting nursing bra and air dry your nipples for 10 to 15 minutes after each feeding.   Only use cotton bra pads.   Only use pure lanolin on your nipples after nursing. You do not need to wash it off before nursing.  Take care of yourself by:   Eating well-balanced meals and nutritious snacks.   Drinking milk, fruit juice, and water to satisfy your thirst (about 8 glasses a day).   Getting plenty of rest.   Increasing calcium in your diet (1200 mg a day).   Avoiding foods that you notice affect the baby in a bad way.  SEEK MEDICAL CARE IF:   You have any questions or difficulty with breastfeeding.   You need help.   You have a hard, red, sore area on your breast, accompanied by a fever of 100.5 F (38.1 C) or more.   Your baby is too sleepy to eat well or is having trouble sleeping.   Your baby is wetting less than 6 diapers per day, by 81 days of age.   Your baby's skin or white part of his or her eyes is more yellow than it was in the hospital.   You feel depressed.  Document Released: 12/08/2005 Document Revised: 11/27/2011 Document Reviewed: 07/23/2009 [facility] Patient Information 2012 [place], [place].

## 2012-05-08 NOTE — Discharge Summary (Signed)
Physician Discharge Summary  Patient ID: Shelia Tucker MRN: [id number] DOB/AGE: [birth year] 36 y.o.  Admit date: 05/04/2012 Discharge date: 05/08/2012  Admission Diagnoses:term pg, PPROM  Discharge Diagnoses:  Principal Problem:  *Status post repeat low transverse cesarean section Active Problems:  Single kidney  PROM (premature rupture of membranes)   Discharged Condition: stable  Hospital Course: term pg, PPROM, FTP C/S  Consults: None  Significant Diagnostic Studies: labs:  Results for orders placed during the hospital encounter of 05/04/12 (from the past 72 hour(s))  CBC     Status: Abnormal   Collection Time   05/06/12  5:00 AM      Component Value Range Comment   WBC 15.2 (*) 4.0 - 10.5 (K/uL)    RBC 3.26 (*) 3.87 - 5.11 (MIL/uL)    Hemoglobin 9.3 (*) 12.0 - 15.0 (g/dL)    HCT 40.9 (*) 81.1 - 46.0 (%)    MCV 88.3  78.0 - 100.0 (fL)    MCH 28.5  26.0 - 34.0 (pg)    MCHC 32.3  30.0 - 36.0 (g/dL)    RDW 91.4  78.2 - 95.6 (%)    Platelets 175  150 - 400 (K/uL)     Treatments: IV hydration  Discharge Exam: Blood pressure 123/83, pulse 76, temperature 98.1 F (36.7 C), temperature source Oral, resp. rate 18, height 5\' 4"  (1.626 m), weight 269 lb (122.018 kg), SpO2 99.00%, unknown if currently breastfeeding. General appearance: alert, cooperative and no distress Calm, no distress, HEENT WNL,  lungs clear bilaterally, AP RRR, abd soft nt,no masses, not tympanic bowel sounds active, abdomen nontender, incision with steri strips healing well approximated no redness, edema, or drainage Normal hair distrubition mons pubis,  +1 pitting edema to lower extremities, DTR 0 bilaterally, no clonus S: breastfeeding well, little bleeding, had bm  Disposition: Final discharge disposition not confirmed  Discharge Orders    Future Orders Please Complete By Expires   OB RESULT CONSOLE Group B Strep      Comments:   This external order was created through the Results Console.   OB  RESULTS CONSOLE GC/Chlamydia      Comments:   This external order was created through the Results Console.   OB RESULTS CONSOLE RPR      Comments:   This external order was created through the Results Console.   OB RESULTS CONSOLE HIV antibody      Comments:   This external order was created through the Results Console.   OB RESULTS CONSOLE Rubella Antibody      Comments:   This external order was created through the Results Console.   OB RESULTS CONSOLE Hepatitis B surface antigen      Comments:   This external order was created through the Results Console.   OB RESULTS CONSOLE ABO/Rh      Comments:   This external order was created through the Results Console.   OB RESULTS CONSOLE Antibody Screen      Comments:   This external order was created through the Results Console.     Medication List  As of 05/08/2012  8:29 AM   STOP taking these medications         prenatal vitamin w/FE, FA 27-1 MG Tabs      ranitidine 75 MG tablet         TAKE these medications         ibuprofen 600 MG tablet   Commonly known as: ADVIL,MOTRIN   Take 1 tablet (  600 mg total) by mouth every 6 (six) hours as needed for pain.      oxyCODONE-acetaminophen 5-325 MG per tablet   Commonly known as: PERCOCET   Take 1-2 tablets by mouth every 4 (four) hours as needed (moderate - severe pain).           Follow-up Information    Follow up with [facility] in 6 weeks.        No plans for birth control options discussed. [facility] handbood discussed. SignedLavera Guise 05/08/2012, 8:29 AM

## 2012-06-30 ENCOUNTER — Ambulatory Visit: Payer: Medicare HMO | Admitting: Obstetrics and Gynecology

## 2012-07-09 ENCOUNTER — Encounter: Payer: Self-pay | Admitting: Obstetrics and Gynecology

## 2012-07-09 ENCOUNTER — Ambulatory Visit (INDEPENDENT_AMBULATORY_CARE_PROVIDER_SITE_OTHER): Payer: Medicare HMO | Admitting: Obstetrics and Gynecology

## 2012-07-09 NOTE — Progress Notes (Signed)
Date of delivery: 05/05/12 Female Name: Shelia Tucker Vaginal delivery:no Cesarean section:yes Dr. Su Hilt Tubal ligation:no GDM:no Breast Feeding:yes Bottle Feeding:no Post-Partum Blues:no Abnormal pap:no Normal GU function: yes Normal GI function:yes Returning to work:yes July 30th  EPDS:   *No BC desired at this.  Subjective:     Shelia Tucker is a 36 y.o. female who presents for a postpartum visit.   She is 5/15 postpartum following a Cesarean section  I have fully reviewed the prenatal and intrapartum course.  The delivery was a term.  Outcome: live female infant Anesthesia: regional Postpartum course has been uncopliacted  Baby's course has been uncomplicated  Baby is feeding by both bottle and breast  Bleeding none at present.  Bowel function is normal and regular Bladder function is normal with no frequency or urgency.  Patient has been sexually active yet. Contraception method is barrier - condoms.  Postpartum depression screening: score = 0 negative     Review of Systems A comprehensive review of systems was negative.   Objective:    BP 110/70  Resp 16  Wt 245 lb (111.131 kg)  Breastfeeding? Yes  General:  alert and cooperative   Breasts:  inspection negative, no nipple discharge or bleeding, no masses or nodularity palpable  Lungs: clear to auscultation bilaterally  Heart:  regular rate and rhythm, S1, S2 normal, no murmur, click, rub or gallop  Abdomen: soft, non-tender; bowel sounds normal; no masses,  no organomegaly   Vulva:  normal  Vagina: normal vagina  Cervix:  no lesions  Corpus: normal  Adnexa:  normal adnexa and no mass, fullness, tenderness  Rectal Exam: Normal rectovaginal exam        Assessment:    8 postpartum exam.  Pap smear. not done  Plan:    1. Contraception: condoms 2. Continues to breast/ bottle feed 3. Follow up in: 1 ys or as needed.    Earl Gala 07/09/2012 5:37 PM

## 2012-07-20 ENCOUNTER — Encounter: Payer: Self-pay | Admitting: Obstetrics and Gynecology

## 2012-09-15 NOTE — Telephone Encounter (Signed)
Preadmission screen  

## 2013-12-02 ENCOUNTER — Encounter (HOSPITAL_COMMUNITY): Payer: Self-pay | Admitting: General Practice

## 2013-12-02 ENCOUNTER — Inpatient Hospital Stay (HOSPITAL_COMMUNITY)
Admission: AD | Admit: 2013-12-02 | Discharge: 2013-12-02 | Disposition: A | Discharge status: Home / Self Care | Payer: Managed Care, Other (non HMO) | Source: Ambulatory Visit | Attending: Obstetrics and Gynecology | Admitting: Obstetrics and Gynecology

## 2013-12-02 ENCOUNTER — Inpatient Hospital Stay (HOSPITAL_COMMUNITY): Payer: Managed Care, Other (non HMO)

## 2013-12-02 DIAGNOSIS — O209 Hemorrhage in early pregnancy, unspecified: Secondary | ICD-10-CM | POA: Insufficient documentation

## 2013-12-02 LAB — TYPE AND SCREEN
ABO/RH(D): O POS
Antibody Screen: NEGATIVE

## 2013-12-02 LAB — ABO/RH: ABO/RH(D): O POS

## 2013-12-02 LAB — URINALYSIS, ROUTINE W REFLEX MICROSCOPIC
Bilirubin Urine: NEGATIVE
Glucose, UA: NEGATIVE mg/dL
Ketones, ur: NEGATIVE mg/dL
Leukocytes, UA: NEGATIVE
Nitrite: NEGATIVE
Protein, ur: NEGATIVE mg/dL
Specific Gravity, Urine: >1.03 — ABNORMAL HIGH (ref 1.005–1.030)
Urobilinogen, UA: 0.2 mg/dL (ref 0.0–1.0)
pH: 6 (ref 5.0–8.0)

## 2013-12-02 LAB — URINE MICROSCOPIC-ADD ON

## 2013-12-02 LAB — CBC
HCT: 35.6 % — ABNORMAL LOW (ref 36.0–46.0)
Hemoglobin: 11.6 g/dL — ABNORMAL LOW (ref 12.0–15.0)
MCH: 27.1 pg (ref 26.0–34.0)
MCHC: 32.6 g/dL (ref 30.0–36.0)

## 2013-12-02 NOTE — MAU Note (Signed)
Pt presents with complaints of having vaginal bleeding and was told to come over to have an ultrasound for a possible miscarriage. Denies any cramping at this time.

## 2013-12-02 NOTE — MAU Provider Note (Signed)
History   37 yo, G2P1011 with possible but not confirmed pregnancy followed by loss per pt and pt's PCP. Pt was being followed for pregnancy loss by PCP, who followed her with quants and u/s.  Pt unclear if there was an u/s that confirmed an IUP, and an ectopic has not ruled out.  Pt reports no pain throughout the bleeding.  Pt reports bleeding for about a week with passing of 1 large clot and 1 small clot.  Denies recent fever, resp or GI c/o's, UTI s/s .   Chief Complaint  Patient presents with  . Vaginal Bleeding   HPI  OB History   Grav Para Term Preterm Abortions TAB SAB Ect Mult Living   2 1 1  0 1 0 1 0 0 1      Past Medical History  Diagnosis Date  . 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 Trichomonas   . Condylomata acuminata in female   . H/O candidiasis   . History of varicella   . Cystitis     Past Surgical History  Procedure Laterality Date  . Donated kidney  2009  . Cesarean section  05/05/2012    Procedure: CESAREAN SECTION;  Surgeon: Purcell Nails, MD;  Location: [facility];  Service: Gynecology;  Laterality: N/A;    Family History  Problem Relation Age of Onset  . Hypertension Mother   . Hypertension Paternal Aunt   . Heart disease Maternal Grandmother     History  Substance Use Topics  . Smoking status: Never Smoker   . Smokeless tobacco: Never Used  . Alcohol Use: No    Allergies: No Known Allergies  No prescriptions prior to admission    ROS ROS: see HPI above, all other systems are negative  Physical Exam   Blood pressure 129/75, pulse 69, temperature 98.4 F (36.9 C), temperature source Oral, resp. rate 18, height 5\' 7"  (1.702 m), weight 238 lb (107.956 kg), currently breastfeeding.  Physical Exam Chest: Clear Heart: RRR Abdomen: gravid, NT Extremities: WNL  Pelvic exam: normal external genitalia, vulva, vagina, cervix, uterus and adnexa. Cervix closed with small amounts of brown bloody discharge from cervical os.  U/S  FINDINGS:  Intrauterine gestational sac: Not present  Yolk sac: Not present  Embryo: Not present  Cardiac Activity: Not present  Maternal uterus/adnexae: There is mixed echogenicity material demonstrated within the lower uterine segment and endocervical canal.   IMPRESSION:  No normal IUP seen. Probable blood clot in LUS which may be seen with incomplete SAB, but still cannot exclude early IUP or occult ectopic. Recommend further followup of quantitative BHCG levels.   Results for orders placed during the hospital encounter of 12/02/13 (from the past 24 hour(s))  URINALYSIS, ROUTINE W REFLEX MICROSCOPIC     Status: Abnormal   Collection Time    12/02/13 11:25 AM      Result Value Range   Color, Urine YELLOW  YELLOW   APPearance CLEAR  CLEAR   Specific Gravity, Urine >1.030 (*) 1.005 - 1.030   pH 6.0  5.0 - 8.0   Glucose, UA NEGATIVE  NEGATIVE mg/dL   Hgb urine dipstick LARGE (*) NEGATIVE   Bilirubin Urine NEGATIVE  NEGATIVE   Ketones, ur NEGATIVE  NEGATIVE mg/dL   Protein, ur NEGATIVE  NEGATIVE mg/dL   Urobilinogen, UA 0.2  0.0 - 1.0 mg/dL   Nitrite NEGATIVE  NEGATIVE   Leukocytes, UA NEGATIVE  NEGATIVE  URINE MICROSCOPIC-ADD ON  Status: Abnormal   Collection Time    12/02/13 11:25 AM      Result Value Range   Squamous Epithelial / LPF RARE  RARE   WBC, UA 0-2  <3 WBC/hpf   RBC / HPF 11-20  <3 RBC/hpf   Bacteria, UA FEW (*) RARE  POCT PREGNANCY, URINE     Status: Abnormal   Collection Time    12/02/13 12:06 PM      Result Value Range   Preg Test, Ur POSITIVE (*) NEGATIVE  HCG, QUANTITATIVE, PREGNANCY     Status: Abnormal   Collection Time    12/02/13  1:58 PM      Result Value Range   hCG, Beta Chain, Quant, S 805 (*) <5 mIU/mL  TYPE AND SCREEN     Status: None   Collection Time    12/02/13  1:58 PM      Result Value Range   ABO/RH(D) O POS     Antibody Screen NEG     Sample Expiration 12/05/2013    CBC     Status: Abnormal   Collection Time    12/02/13  1:58  PM      Result Value Range   WBC 8.2  4.0 - 10.5 K/uL   RBC 4.28  3.87 - 5.11 MIL/uL   Hemoglobin 11.6 (*) 12.0 - 15.0 g/dL   HCT 16.1 (*) 09.6 - 04.5 %   MCV 83.2  78.0 - 100.0 fL   MCH 27.1  26.0 - 34.0 pg   MCHC 32.6  30.0 - 36.0 g/dL   RDW 40.9  81.1 - 91.4 %   Platelets 255  150 - 400 K/uL  ABO/RH     Status: None   Collection Time    12/02/13  1:58 PM      Result Value Range   ABO/RH(D) O POS     ED Course  Probably pregnancy loss Bleeding  C/w Dr. Su Hilt  Repeat quants in 48 hours then weekly until zero Precautions discussed D/c home with f/u on 12/04/13 at MAU  Haroldine Laws CNM, MSN 12/02/2013 2:31 PM

## 2013-12-02 NOTE — MAU Note (Signed)
Pt to MAU to check status of miscarriage. Pt states she passed clots and tissue on Tuesday but was told to come to MAU to see if D&C is needed.

## 2013-12-04 ENCOUNTER — Inpatient Hospital Stay (HOSPITAL_COMMUNITY)
Admission: AD | Admit: 2013-12-04 | Discharge: 2013-12-04 | Disposition: A | Discharge status: Home / Self Care | Payer: Managed Care, Other (non HMO) | Source: Ambulatory Visit | Attending: Obstetrics and Gynecology | Admitting: Obstetrics and Gynecology

## 2013-12-04 ENCOUNTER — Encounter (HOSPITAL_COMMUNITY): Payer: Self-pay

## 2013-12-04 DIAGNOSIS — O00109 Unspecified tubal pregnancy without intrauterine pregnancy: Secondary | ICD-10-CM | POA: Insufficient documentation

## 2013-12-04 DIAGNOSIS — O209 Hemorrhage in early pregnancy, unspecified: Secondary | ICD-10-CM | POA: Insufficient documentation

## 2013-12-04 MED ORDER — MISOPROSTOL 200 MCG PO TABS: 400.0000 ug | ORAL_TABLET | Freq: Once | ORAL | Status: DC

## 2013-12-04 NOTE — MAU Provider Note (Signed)
  History     CSN: [id number]  Arrival date and time: 12/04/13 1204   None     Chief Complaint  Patient presents with  . Follow-up   HPI Comments: Pt is a 37yo G3P1 at unknown gestation, here for f/u quant from Friday. Pt denies any pain, reports very scant spotting. Unknown LMP, having irregular periods. Has been seen by her PCP (Shelia Tucker) at [facility]. States she's had BHCG and Ultrasound there, unsure if ever confirmed IUP. Seen here in MAU on Friday and had BHCG and [place]. (did not rule out ectopic)        Past Medical History  Diagnosis Date  . 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 Trichomonas   . Condylomata acuminata in female   . H/O candidiasis   . History of varicella   . Cystitis     Past Surgical History  Procedure Laterality Date  . Donated kidney  2009  . Cesarean section  05/05/2012    Procedure: CESAREAN SECTION;  Surgeon: Purcell Nails, MD;  Location: [facility];  Service: Gynecology;  Laterality: N/A;    Family History  Problem Relation Age of Onset  . Hypertension Mother   . Hypertension Paternal Aunt   . Heart disease Maternal Grandmother     History  Substance Use Topics  . Smoking status: Never Smoker   . Smokeless tobacco: Never Used  . Alcohol Use: No    Allergies: No Known Allergies  No prescriptions prior to admission    Review of Systems  All other systems reviewed and are negative.   Physical Exam   Blood pressure 111/65, pulse 69, temperature 98.3 F (36.8 C), temperature source Oral, resp. rate 18, SpO2 100.00%.  Physical Exam  Nursing note and vitals reviewed. Constitutional: She is oriented to person, place, and time. She appears well-developed and well-nourished.  HENT:  Head: Normocephalic.  Eyes: Pupils are equal, round, and reactive to light.  Neck: Normal range of motion.  Cardiovascular: Normal rate.   Respiratory: Effort normal.  GI: Soft.  Genitourinary:  Pelvic deferred    Musculoskeletal: Normal range of motion.  Neurological: She is alert and oriented to person, place, and time. She has normal reflexes.  Skin: Skin is warm and dry.  Psychiatric: She has a normal mood and affect. Her behavior is normal.    MAU Course  Procedures    Assessment and Plan  37yo Miscarriage likely, although ectopic not currently ruled out Asymptomatic currently Reviewed options for mgmnt including 1. Observation 2. cytotec 3. D&C  Pt requesting cytotec at this time D/w Shelia Tucker, and will obtain results from PCP before proceeding w cytotec in order to absolutely confirm IUP and rule out ectopic.  ROI form obtained, will fax Shelia Tucker office and have office f/u urgently and call pt tmrw.   (Please call pt at [phone] (cell) or [phone] (work @pt 's desk)   Pt dc'd home in stable condition, rv'd ectopic and bleeding precautions Shelia Tucker, CNM   Shelia Tucker M 12/04/2013, 1:59 PM

## 2013-12-04 NOTE — MAU Note (Signed)
Patient to MAU for repeat BHCG. Patient states she is not having any pain or bleeding.

## 2014-10-23 ENCOUNTER — Encounter (HOSPITAL_COMMUNITY): Payer: Self-pay

## 2014-10-24 ENCOUNTER — Inpatient Hospital Stay (HOSPITAL_COMMUNITY)
Admission: AD | Admit: 2014-10-24 | Discharge: 2014-10-24 | Disposition: A | Discharge status: Home / Self Care | Payer: Managed Care, Other (non HMO) | Source: Ambulatory Visit | Attending: Obstetrics & Gynecology | Admitting: Obstetrics & Gynecology

## 2014-10-24 ENCOUNTER — Encounter (HOSPITAL_COMMUNITY): Payer: Self-pay

## 2014-10-24 ENCOUNTER — Inpatient Hospital Stay (HOSPITAL_COMMUNITY): Payer: Managed Care, Other (non HMO)

## 2014-10-24 DIAGNOSIS — Z3A01 Less than 8 weeks gestation of pregnancy: Secondary | ICD-10-CM | POA: Insufficient documentation

## 2014-10-24 DIAGNOSIS — O3411 Maternal care for benign tumor of corpus uteri, first trimester: Secondary | ICD-10-CM | POA: Insufficient documentation

## 2014-10-24 DIAGNOSIS — O209 Hemorrhage in early pregnancy, unspecified: Secondary | ICD-10-CM

## 2014-10-24 LAB — URINE MICROSCOPIC-ADD ON

## 2014-10-24 LAB — CBC
HCT: 38.6 % (ref 36.0–46.0)
Hemoglobin: 12.3 g/dL (ref 12.0–15.0)
MCH: 26.2 pg (ref 26.0–34.0)
MCHC: 31.9 g/dL (ref 30.0–36.0)
MCV: 82.3 fL (ref 78.0–100.0)
PLATELETS: 262 10*3/uL (ref 150–400)
RBC: 4.69 MIL/uL (ref 3.87–5.11)
RDW: 14.4 % (ref 11.5–15.5)
WBC: 9.1 10*3/uL (ref 4.0–10.5)

## 2014-10-24 LAB — POCT PREGNANCY, URINE: Preg Test, Ur: POSITIVE — AB

## 2014-10-24 LAB — URINALYSIS, ROUTINE W REFLEX MICROSCOPIC
Bilirubin Urine: NEGATIVE
Glucose, UA: NEGATIVE mg/dL
KETONES UR: 15 mg/dL — AB
LEUKOCYTES UA: NEGATIVE
NITRITE: NEGATIVE
PROTEIN: NEGATIVE mg/dL
Specific Gravity, Urine: 1.025 (ref 1.005–1.030)
Urobilinogen, UA: 0.2 mg/dL (ref 0.0–1.0)
pH: 6 (ref 5.0–8.0)

## 2014-10-24 LAB — HCG, QUANTITATIVE, PREGNANCY: hCG, Beta Chain, Quant, S: 58 m[IU]/mL — ABNORMAL HIGH (ref <5)

## 2014-10-24 NOTE — Discharge Instructions (Signed)
Vaginal Bleeding During Pregnancy, First Trimester  A small amount of bleeding (spotting) from the vagina is relatively common in early pregnancy. It usually stops on its own. Various things may cause bleeding or spotting in early pregnancy. Some bleeding may be related to the pregnancy, and some may not. In most cases, the bleeding is normal and is not a problem. However, bleeding can also be a sign of something serious. Be sure to tell your health care provider about any vaginal bleeding right away.  Some possible causes of vaginal bleeding during the first trimester include:  · Infection or inflammation of the cervix.  · Growths (polyps) on the cervix.  · Miscarriage or threatened miscarriage.  · Pregnancy tissue has developed outside of the uterus and in a fallopian tube (tubal pregnancy).  · Tiny cysts have developed in the uterus instead of pregnancy tissue (molar pregnancy).  HOME CARE INSTRUCTIONS   Watch your condition for any changes. The following actions may help to lessen any discomfort you are feeling:  · Follow your health care provider's instructions for limiting your activity. If your health care provider orders bed rest, you may need to stay in bed and only get up to use the bathroom. However, your health care provider may allow you to continue light activity.  · If needed, make plans for someone to help with your regular activities and responsibilities while you are on bed rest.  · Keep track of the number of pads you use each day, how often you change pads, and how soaked (saturated) they are. Write this down.  · Do not use tampons. Do not douche.  · Do not have sexual intercourse or orgasms until approved by your health care provider.  · If you pass any tissue from your vagina, save the tissue so you can show it to your health care provider.  · Only take over-the-counter or prescription medicines as directed by your health care provider.  · Do not take aspirin because it can make you  bleed.  · Keep all follow-up appointments as directed by your health care provider.  SEEK MEDICAL CARE IF:  · You have any vaginal bleeding during any part of your pregnancy.  · You have cramps or labor pains.  · You have a fever, not controlled by medicine.  SEEK IMMEDIATE MEDICAL CARE IF:   · You have severe cramps in your back or belly (abdomen).  · You pass large clots or tissue from your vagina.  · Your bleeding increases.  · You feel light-headed or weak, or you have fainting episodes.  · You have chills.  · You are leaking fluid or have a gush of fluid from your vagina.  · You pass out while having a bowel movement.  MAKE SURE YOU:  · Understand these instructions.  · Will watch your condition.  · Will get help right away if you are not doing well or get worse.  Document Released: 09/17/2005 Document Revised: 12/13/2013 Document Reviewed: 08/15/2013  ExitCare® Patient Information ©2015 [employer]. This information is not intended to replace advice given to you by your health care provider. Make sure you discuss any questions you have with your health care provider.

## 2014-10-24 NOTE — MAU Provider Note (Signed)
Chief Complaint  Patient presents with  . Possible Pregnancy  . Vaginal Bleeding    Subjective Shelia Tucker 38 y.o.  G3P1011 with +HPT at physical on 10/10/14, unknown LMP, irregular cycles. She presents with onset yesterday of first episode of small amount pink vaginal bleeding. Bleeding today was redder and enough to wear a miniipad. Denies cramps or abd pain. Last intercourse weeks ago. Denies irritative vaginal discharge. No dysuria or hematuria. Declines testing for STI. Pregnancy is desired.  She has been seen this pregnancy by PCP at [facility] in [place] and had quants done due to unsure LMP. States value was 102 a week ago and 48 two weeks ago.  Blood type: O pos  Pregnancy course: Plans to set up OB care with Dr. 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 acetaminophen (TYLENOL) 500 MG tablet Take 500 mg by mouth every 6 (six) hours as needed for mild pain or headache.   Past Month at Unknown time  . Prenatal Vit-Fe Fumarate-FA (PRENATAL MULTIVITAMIN) TABS tablet Take 1 tablet by mouth daily at 12 noon.   10/24/2014 at Unknown time  . misoprostol (CYTOTEC) 200 MCG tablet Place 2 tablets (400 mcg total) vaginally once. (Patient not taking: Reported on 10/24/2014) 2 tablet 0     No Known Allergies   Objective   Filed Vitals:   10/24/14 1245  BP: 117/63  Pulse: 76  Temp: 99 F (37.2 C)  Resp: 18     Physical Exam General: WN/WD in NAD  Abdom: soft, NT External genitalia: normal; BUS neg  SSE: small amount dark blood; cervix with no lesions or apparent active bleeding Bimanual: Cervix nulliparous closed, long; uterus anteverted, NT, ULNS; adnexa nontender, no masses   Lab Results Results for orders placed or performed during the hospital  encounter of 10/24/14 (from the past 24 hour(s))  Urinalysis, Routine w reflex microscopic     Status: Abnormal   Collection Time: 10/24/14 12:50 PM  Result Value Ref Range   Color, Urine YELLOW YELLOW   APPearance HAZY (A) CLEAR   Specific Gravity, Urine 1.025 1.005 - 1.030   pH 6.0 5.0 - 8.0   Glucose, UA NEGATIVE NEGATIVE mg/dL   Hgb urine dipstick LARGE (A) NEGATIVE   Bilirubin Urine NEGATIVE NEGATIVE   Ketones, ur 15 (A) NEGATIVE mg/dL   Protein, ur NEGATIVE NEGATIVE mg/dL   Urobilinogen, UA 0.2 0.0 - 1.0 mg/dL   Nitrite NEGATIVE NEGATIVE   Leukocytes, UA NEGATIVE NEGATIVE  Urine microscopic-add on     Status: Abnormal   Collection Time: 10/24/14 12:50 PM  Result Value Ref Range   Squamous Epithelial / LPF FEW (A) RARE   WBC, UA 0-2 <3 WBC/hpf   RBC / HPF 21-50 <3 RBC/hpf   Bacteria, UA RARE RARE  Pregnancy, urine POC     Status: Abnormal   Collection Time: 10/24/14  1:17 PM  Result Value Ref Range   Preg Test, Ur POSITIVE (A) NEGATIVE  hCG, quantitative, pregnancy     Status: Abnormal   Collection Time: 10/24/14  1:22 PM  Result Value Ref Range   hCG, Beta Chain, Quant, S 58 (H) <5 mIU/mL  CBC     Status: None   Collection Time: 10/24/14  1:39 PM  Result Value Ref Range   WBC 9.1 4.0 - 10.5  K/uL   RBC 4.69 3.87 - 5.11 MIL/uL   Hemoglobin 12.3 12.0 - 15.0 g/dL   HCT 34.738.6 42.536.0 - 95.646.0 %   MCV 82.3 78.0 - 100.0 fL   MCH 26.2 26.0 - 34.0 pg   MCHC 31.9 30.0 - 36.0 g/dL   RDW 38.714.4 56.411.5 - 33.215.5 %   Platelets 262 150 - 400 K/uL    Ultrasound  Narrative        CLINICAL DATA: Pregnant, bleeding, beta HCG 58  EXAM: OBSTETRIC <14 WK US AND TRANSVAGINAL OB US  TECHNIQUE: Both transabdominal and transvaginal ultrasound examinations were performed for complete evaluation of the gestation as well as the maternal uterus, adnexal regions, and pelvic cul-de-sac. Transvaginal technique was performed to assess early pregnancy.  COMPARISON:  None.  FINDINGS: Intrauterine gestational sac: Not visualized  Maternal uterus/adnexae: Endometrial complex measures 14 mm.  Suspected 6 x 7 x 7 mm intramural fibroid in the uterine fundus. Additional 6 x 5 x 5 mm intramural fibroid in the left uterine body.  Left ovary is within normal limits, measuring 2.9 x 1.6 x 2.9 cm.  Right ovary measures 3.6 x 1.9 x 2.8 cm and is notable for a suspected 1.3 cm corpus luteal cyst.  Trace pelvic fluid.  IMPRESSION: No IUP is visualized.  This is not unexpected given the low beta HCG. However, by definition, this reflects a pregnancy of unknown location. Differential considerations include early normal IUP, abnormal IUP/missed abortion, or nonvisualized ectopic pregnancy.  Serial beta HCG is suggested, supplemented by repeat pelvic ultrasound in 14 days (or earlier as clinically warranted).   Electronically Signed By: Charline BillsSriyesh Krishnan M.D. On: 10/24/2014 14:59               Received quant B  hcg reports from [facility]: 10/10/14: 48; 10/18/14: 104 (scanned into Media)  Assessment 1. Bleeding in early pregnancy   EPF, unknown location Small fibroids  Plan    C/W Dr. Debroah LoopArnold> return for F/U quant 1 wk Discharge home with ectopic precautions. Explained fibroids See AVS for pt education   Medication List    STOP taking these medications        misoprostol 200 MCG tablet  Commonly known as:  CYTOTEC      TAKE these medications        acetaminophen 500 MG tablet  Commonly known as:  TYLENOL  Take 500 mg by mouth every 6 (six) hours as needed for mild pain or headache.     prenatal multivitamin Tabs tablet  Take 1 tablet by mouth daily at 12 noon.       Follow-up Information    Follow up with Nursepractioner Mau, NP In 1 week.   Why:  Repeat blood test        Davin Muramoto 10/24/2014 2:18 PM

## 2014-10-24 NOTE — MAU Note (Signed)
Patient states she has had a positive pregnancy at her [place] in [place]. States she has had some spotting with stringy discharge, no pain.

## 2014-11-03 ENCOUNTER — Inpatient Hospital Stay (HOSPITAL_COMMUNITY)
Admission: AD | Admit: 2014-11-03 | Discharge: 2014-11-03 | Disposition: A | Discharge status: Home / Self Care | Payer: Managed Care, Other (non HMO) | Source: Ambulatory Visit | Attending: Obstetrics & Gynecology | Admitting: Obstetrics & Gynecology

## 2014-11-03 ENCOUNTER — Encounter (HOSPITAL_COMMUNITY): Payer: Self-pay | Admitting: Medical

## 2014-11-03 DIAGNOSIS — O039 Complete or unspecified spontaneous abortion without complication: Secondary | ICD-10-CM | POA: Diagnosis present

## 2014-11-03 LAB — HCG, QUANTITATIVE, PREGNANCY: hCG, Beta Chain, Quant, S: <1 m[IU]/mL (ref <5)

## 2014-11-03 NOTE — Discharge Instructions (Signed)
Miscarriage °A miscarriage is the loss of an unborn baby (fetus) before the 20th week of pregnancy. The cause is often unknown.  °HOME CARE °· You may need to stay in bed (bed rest), or you may be able to do light activity. Go about activity as told by your doctor. °· Have help at home. °· Write down how many pads you use each day. Write down how soaked they are. °· Do not use tampons. Do not wash out your vagina (douche) or have sex (intercourse) until your doctor approves. °· Only take medicine as told by your doctor. °· Do not take aspirin. °· Keep all doctor visits as told. °· If you or your partner have problems with grieving, talk to your doctor. You can also try counseling. Give yourself time to grieve before trying to get pregnant again. °GET HELP RIGHT AWAY IF: °· You have bad cramps or pain in your back or belly (abdomen). °· You have a fever. °· You pass large clumps of blood (clots) from your vagina that are walnut-sized or larger. Save the clumps for your doctor to see. °· You pass large amounts of tissue from your vagina. Save the tissue for your doctor to see. °· You have more bleeding. °· You have thick, bad-smelling fluid (discharge) coming from the vagina. °· You get lightheaded, weak, or you pass out (faint). °· You have chills. °MAKE SURE YOU: °· Understand these instructions. °· Will watch your condition. °· Will get help right away if you are not doing well or get worse. °Document Released: 03/01/2012 Document Reviewed: 03/01/2012 °ExitCare® Patient Information ©2015 [employer]. This information is not intended to replace advice given to you by your health care provider. Make sure you discuss any questions you have with your health care provider. ° °

## 2014-11-03 NOTE — MAU Provider Note (Signed)
Shelia Tucker is a 38 y.o. G4P1011 at Unknown who presents to MAU today for follow-up quant hCG. The patient states that after leaving MAU last week she had heavier bleeding for a few days. Bleeding is minimal today. She states very mild lower abdominal cramping. She denies N/V or fever.   BP 135/78 mmHg  Pulse 69  Resp 18 GENERAL: Well-developed, well-nourished female in no acute distress.  HEENT: Normocephalic, atraumatic.   LUNGS: Effort normal HEART: Regular rate  SKIN: Warm, dry and without erythema PSYCH: Normal mood and affect  A: Complete AB  P: Discharge home Patient advised to follow-up with OB provider of choice Advised condom use if pregnancy is not desired Patient may return to MAU as needed or if her condition were to change or worsen   Marny LowensteinJulie N Oralee Rapaport, PA-C 11/03/2014 4:30 PM

## 2015-07-09 LAB — OB RESULTS CONSOLE GC/CHLAMYDIA
CHLAMYDIA, DNA PROBE: NEGATIVE
GC PROBE AMP, GENITAL: NEGATIVE

## 2015-07-09 LAB — OB RESULTS CONSOLE HEPATITIS B SURFACE ANTIGEN: Hepatitis B Surface Ag: NEGATIVE

## 2015-07-09 LAB — OB RESULTS CONSOLE RUBELLA ANTIBODY, IGM: RUBELLA: IMMUNE

## 2015-07-09 LAB — OB RESULTS CONSOLE HIV ANTIBODY (ROUTINE TESTING): HIV: NONREACTIVE

## 2015-07-09 LAB — OB RESULTS CONSOLE ABO/RH: RH Type: POSITIVE

## 2015-07-09 LAB — OB RESULTS CONSOLE ANTIBODY SCREEN: Antibody Screen: NEGATIVE

## 2015-08-20 ENCOUNTER — Other Ambulatory Visit (HOSPITAL_COMMUNITY): Payer: Self-pay | Admitting: Obstetrics and Gynecology

## 2015-08-20 DIAGNOSIS — Z3A19 19 weeks gestation of pregnancy: Secondary | ICD-10-CM

## 2015-08-20 DIAGNOSIS — Z3689 Encounter for other specified antenatal screening: Secondary | ICD-10-CM

## 2015-08-20 DIAGNOSIS — O09522 Supervision of elderly multigravida, second trimester: Secondary | ICD-10-CM

## 2015-08-20 DIAGNOSIS — O280 Abnormal hematological finding on antenatal screening of mother: Secondary | ICD-10-CM

## 2015-08-21 ENCOUNTER — Ambulatory Visit (HOSPITAL_COMMUNITY)
Admission: RE | Admit: 2015-08-21 | Discharge: 2015-08-21 | Disposition: A | Discharge status: Home / Self Care | Payer: Managed Care, Other (non HMO) | Source: Ambulatory Visit | Attending: Obstetrics and Gynecology | Admitting: Obstetrics and Gynecology

## 2015-08-21 ENCOUNTER — Encounter (HOSPITAL_COMMUNITY): Payer: Self-pay

## 2015-08-21 DIAGNOSIS — O280 Abnormal hematological finding on antenatal screening of mother: Secondary | ICD-10-CM | POA: Insufficient documentation

## 2015-08-21 DIAGNOSIS — O09529 Supervision of elderly multigravida, unspecified trimester: Secondary | ICD-10-CM | POA: Insufficient documentation

## 2015-08-21 NOTE — Progress Notes (Signed)
Genetic Counseling  High-Risk Gestation Note  Appointment Date:  08/21/2015 Referred By: Shelia Better, MD Date of Birth:  04-12-1976   Pregnancy History: [id number]  Estimated Date of Delivery: 01/30/16 Estimated Gestational Age: [redacted]w[redacted]d Attending: Particia Nearing, MD   [name] was seen for genetic counseling because of an increased Down syndrome risk and increased Trisomy 18 risk from Quad screen through her OB. The patient will be 39 years old at delivery.   In Summary:   Quad screen from [facility] increased Down syndrome risk to 1 in 2  Quad screen also increased risk for Trisomy 18 to 1 in 79  Patient had NIPS (InformaSeq) drawn at [facility] office on 8/29, currently pending  Reviewed age-related risk for fetal aneuploidy and additional screening and testing options  Detailed ultrasound scheduled in our office for 09/05/15  Patient declined amniocentesis  Quad screen also has low uE3 value (0.18 MoM)  Discussed other explanations including increased risk for adverse pregnancy outcomes, Smith-Lemli Opitz and Steroid Sulfatase Deficiency  Patient declined carrier screening for SLOS today (available through expanded carrier screening panel)  She was counseled regarding maternal age and the association with risk for chromosome conditions due to nondisjunction with aging of the ova.   We reviewed chromosomes, nondisjunction, and the associated 1 in 60 risk for fetal aneuploidy related to a maternal age of 39 y.o. at [redacted]w[redacted]d gestation.  She was counseled that the risk for aneuploidy decreases as gestational age increases, accounting for those pregnancies which spontaneously abort.    She was counseled regarding the Quad screen result and the associated 1 in 58 risk for fetal Down syndrome and 1 in 79 risk for fetal trisomy 18, which are both increased above the patient's age related risk.  In addition, we reviewed the screen adjusted reduction in risks for ONTDs.  We also discussed  other explanations for a screen positive result including: a gestational dating error, differences in maternal metabolism, and normal variation. They understand that this screening is not diagnostic for these conditions but provides a risk assessment.  Additionally, we discussed that given that the level of one protein on the Quad screen called unconjugated estriol (uE3) was lower than normal (0.18 MoM), the chance for two conditions called smith lemli opitz syndrome (SLOS) and x-linked ichthyosis (also called steroid sulfatase deficiency) is slightly increased.  SLOS is an autosomal recessive condition characterized by multiple birth defects and intellectual disability.  Some physical differences associated with SLOS may be detected prenatally, although ultrasound cannot detect all abnormalities.  X-linked ichthyosis is an X-linked recessive skin disorder, where carrier females (normal and healthy) have an increased risk to have affected female children.  Affected males have severely dry and scaly skin.  Prenatal testing for these two conditions is available via amniocentesis if desired.  Still, the greatest chance is that the baby does not have either of these conditions. We also discussed the option of carrier screening for Shelia Tucker via an expanded carrier screening panel. We reviewed that this assesses for common disease causing mutations for SLOS and detects approximately 67% of carriers. Thus, a negative carrier screen would reduce but not eliminate the chance to be a carrier. We also reviewed that an expanded carrier screening panel assesses for multiple autosomal recessive and X-linked conditions that would be unrelated to the Quad screen results. Carrier frequency and detection rates vary by condition and often by ethnicity. [name] declined expanded carrier screening at this time.   We reviewed available screening  options including noninvasive prenatal screening (NIPS)/cell free DNA  (cfDNA) testing and detailed ultrasound.  She was counseled that screening tests are used to modify a patient's a priori risk for aneuploidy, typically based on age. This estimate provides a pregnancy specific risk assessment. We reviewed the benefits and limitations of each option. Specifically, we discussed the methodology of NIPS, the conditions for which the test screens, the detection rates, and false positive rates of each. [name] had NIPS (InformaSeq through [place]) drawn at her OB office on 8/29, and results are currently pending.  She was also counseled regarding diagnostic testing via amniocentesis. We reviewed the approximate 1 in 300-500 risk for complications for amniocentesis, including spontaneous pregnancy loss. [name] declined amniocentesis given the associated risk for complications and given that she wants information for preparation purposes only and not to alter the course of the pregnancy.  She understands that screening tests cannot rule out all birth defects or genetic syndromes. The patient was advised of this limitation and states she still does not want additional testing at this time. Detailed ultrasound is scheduled for 09/05/15.   [name] was provided with written information regarding sickle cell anemia (SCA) including the carrier frequency and incidence in the African-American population, the availability of carrier testing and prenatal diagnosis if indicated.  In addition, we discussed that hemoglobinopathies are routinely screened for as part of the Shelia Tucker newborn screening panel.  She previously had hemoglobin electrophoresis performed, which was within normal range.  Both family histories were reviewed and found to be contributory for medical issues for the patient's maternal grandmother. She reported that her grandmother had epilepsy and possibly mild intellectual disability. She died at age 44 years old from a heart attack. Limited information was known  regarding whether or not an underlying cause was determined for her medical issues .Epilepsy occurs in approximately 1% of the population and can have many causes.  Approximately 80% of epilepsy is thought to be idiopathic while the remaining 20% is secondary to a variety of factors such as perinatal events, infections, trauma and genetic disease. In the absence of a known etiology, epilepsy is thought to be caused by a combination of genetic and environmental factors, called multifactorial inheritance. We discussed the heart disease can also have multiple causes including environment, genetics, and multifactorial. The earlier age of onset increases the chance for an underlying genetic component. It would be important for physicians to be aware of this history so that individuals are screened and followed appropriately. There are many different causes of intellectual disabilities including environmental, multifactorial, and genetic etiologies.  We discussed that a specific diagnosis for intellectual disability can be determined in approximately 50% of these individuals.  In the remaining 50% of individuals, a diagnosis may never be determined. We discussed that without more specific information, it is difficult to provide an accurate risk assessment.  Further genetic counseling is warranted if more information is obtained.  Mrs. Mcclellan denied exposure to environmental toxins or chemical agents. She reported having one dental xray on 8/22 and had double shielding. The dose of ionizing radiation is an important factor in determining the potential toxicity to a pregnancy. Available study data suggest x ray exposure at 5 rad or less is not associated with an increased risk for birth defects. The amount from a typical dental x ray would be expected to be below this threshold. She denied the use of alcohol, tobacco or street drugs. She denied significant viral illnesses during the course of  her pregnancy. Her medical  and surgical histories were noncontributory.   I counseled Mrs. Koleen Distance regarding the above risks and available options.  The approximate face-to-face time with the genetic counselor was 50 minutes.  Quinn Plowman, MS,  Certified Genetic Counselor 08/21/2015

## 2015-08-24 ENCOUNTER — Other Ambulatory Visit (HOSPITAL_COMMUNITY): Payer: Self-pay | Admitting: Obstetrics and Gynecology

## 2015-09-05 ENCOUNTER — Encounter (HOSPITAL_COMMUNITY): Payer: Self-pay

## 2015-09-05 ENCOUNTER — Ambulatory Visit (HOSPITAL_COMMUNITY)
Admission: RE | Admit: 2015-09-05 | Discharge: 2015-09-05 | Disposition: A | Discharge status: Home / Self Care | Payer: Managed Care, Other (non HMO) | Source: Ambulatory Visit | Attending: Obstetrics and Gynecology | Admitting: Obstetrics and Gynecology

## 2015-09-05 ENCOUNTER — Other Ambulatory Visit (HOSPITAL_COMMUNITY): Payer: Self-pay | Admitting: Obstetrics and Gynecology

## 2015-09-05 VITALS — BP 110/70 | HR 84 | Wt 249.0 lb

## 2015-09-05 DIAGNOSIS — O283 Abnormal ultrasonic finding on antenatal screening of mother: Secondary | ICD-10-CM | POA: Insufficient documentation

## 2015-09-05 DIAGNOSIS — O3421 Maternal care for scar from previous cesarean delivery: Secondary | ICD-10-CM | POA: Insufficient documentation

## 2015-09-05 DIAGNOSIS — Z3A19 19 weeks gestation of pregnancy: Secondary | ICD-10-CM | POA: Insufficient documentation

## 2015-09-05 DIAGNOSIS — O09522 Supervision of elderly multigravida, second trimester: Secondary | ICD-10-CM

## 2015-09-05 DIAGNOSIS — O34219 Maternal care for unspecified type scar from previous cesarean delivery: Secondary | ICD-10-CM

## 2015-09-05 DIAGNOSIS — O289 Unspecified abnormal findings on antenatal screening of mother: Secondary | ICD-10-CM

## 2015-09-05 DIAGNOSIS — E669 Obesity, unspecified: Secondary | ICD-10-CM | POA: Diagnosis not present

## 2015-09-05 DIAGNOSIS — O99212 Obesity complicating pregnancy, second trimester: Secondary | ICD-10-CM | POA: Insufficient documentation

## 2015-09-05 DIAGNOSIS — O2690 Pregnancy related conditions, unspecified, unspecified trimester: Secondary | ICD-10-CM

## 2015-09-05 DIAGNOSIS — Z3689 Encounter for other specified antenatal screening: Secondary | ICD-10-CM

## 2015-09-05 DIAGNOSIS — O280 Abnormal hematological finding on antenatal screening of mother: Secondary | ICD-10-CM

## 2015-09-06 ENCOUNTER — Other Ambulatory Visit (HOSPITAL_COMMUNITY): Payer: Self-pay | Admitting: Obstetrics and Gynecology

## 2015-09-10 ENCOUNTER — Other Ambulatory Visit (HOSPITAL_COMMUNITY): Payer: Self-pay | Admitting: Obstetrics and Gynecology

## 2015-10-17 ENCOUNTER — Ambulatory Visit (HOSPITAL_COMMUNITY)
Admission: RE | Admit: 2015-10-17 | Discharge: 2015-10-17 | Disposition: A | Discharge status: Home / Self Care | Payer: Managed Care, Other (non HMO) | Source: Ambulatory Visit | Attending: Maternal and Fetal Medicine | Admitting: Maternal and Fetal Medicine

## 2015-10-17 ENCOUNTER — Other Ambulatory Visit (HOSPITAL_COMMUNITY): Payer: Self-pay | Admitting: Maternal and Fetal Medicine

## 2015-10-17 ENCOUNTER — Encounter (HOSPITAL_COMMUNITY): Payer: Self-pay

## 2015-10-17 DIAGNOSIS — O34219 Maternal care for unspecified type scar from previous cesarean delivery: Secondary | ICD-10-CM

## 2015-10-17 DIAGNOSIS — O283 Abnormal ultrasonic finding on antenatal screening of mother: Secondary | ICD-10-CM | POA: Insufficient documentation

## 2015-10-17 DIAGNOSIS — O280 Abnormal hematological finding on antenatal screening of mother: Secondary | ICD-10-CM

## 2015-10-17 DIAGNOSIS — Z3A25 25 weeks gestation of pregnancy: Secondary | ICD-10-CM

## 2015-10-17 DIAGNOSIS — E669 Obesity, unspecified: Secondary | ICD-10-CM | POA: Diagnosis not present

## 2015-10-17 DIAGNOSIS — O99212 Obesity complicating pregnancy, second trimester: Secondary | ICD-10-CM

## 2015-10-17 DIAGNOSIS — O289 Unspecified abnormal findings on antenatal screening of mother: Secondary | ICD-10-CM

## 2015-10-17 DIAGNOSIS — O09522 Supervision of elderly multigravida, second trimester: Secondary | ICD-10-CM

## 2015-10-17 DIAGNOSIS — O2693 Pregnancy related conditions, unspecified, third trimester: Secondary | ICD-10-CM

## 2015-11-07 LAB — OB RESULTS CONSOLE RPR: RPR: NONREACTIVE

## 2015-11-28 ENCOUNTER — Other Ambulatory Visit (HOSPITAL_COMMUNITY): Payer: Self-pay | Admitting: Maternal and Fetal Medicine

## 2015-11-28 ENCOUNTER — Ambulatory Visit (HOSPITAL_COMMUNITY)
Admission: RE | Admit: 2015-11-28 | Discharge: 2015-11-28 | Disposition: A | Discharge status: Home / Self Care | Payer: Managed Care, Other (non HMO) | Source: Ambulatory Visit | Attending: Family Medicine | Admitting: Family Medicine

## 2015-11-28 DIAGNOSIS — O34219 Maternal care for unspecified type scar from previous cesarean delivery: Secondary | ICD-10-CM | POA: Diagnosis not present

## 2015-11-28 DIAGNOSIS — O2693 Pregnancy related conditions, unspecified, third trimester: Secondary | ICD-10-CM

## 2015-11-28 DIAGNOSIS — O99213 Obesity complicating pregnancy, third trimester: Secondary | ICD-10-CM | POA: Diagnosis not present

## 2015-11-28 DIAGNOSIS — O09523 Supervision of elderly multigravida, third trimester: Secondary | ICD-10-CM

## 2015-11-28 DIAGNOSIS — Z3A31 31 weeks gestation of pregnancy: Secondary | ICD-10-CM | POA: Diagnosis not present

## 2015-11-28 DIAGNOSIS — O280 Abnormal hematological finding on antenatal screening of mother: Secondary | ICD-10-CM

## 2015-11-28 DIAGNOSIS — O289 Unspecified abnormal findings on antenatal screening of mother: Secondary | ICD-10-CM

## 2015-11-28 DIAGNOSIS — O283 Abnormal ultrasonic finding on antenatal screening of mother: Secondary | ICD-10-CM | POA: Diagnosis not present

## 2016-01-02 LAB — OB RESULTS CONSOLE GBS: GBS: NEGATIVE

## 2016-01-07 ENCOUNTER — Other Ambulatory Visit: Payer: Self-pay | Admitting: Obstetrics and Gynecology

## 2016-01-18 NOTE — Patient Instructions (Signed)
Your procedure is scheduled on:  Wednesday, Feb. 1, 2017  Enter through the [employer] of [facility] at:  6:00 A.M.  Pick up the phone at the desk and dial [phone].  Call this number if you have problems the morning of surgery: [phone].  Remember: Do NOT eat food or drink after:  Midnight Tuesday  Take these medicines the morning of surgery with a SIP OF WATER:  Zantac  Do NOT wear jewelry (body piercing), metal hair clips/bobby pins, or nail polish. Do NOT wear lotions, powders, or perfumes.  You may wear deoderant. Do NOT shave for 48 hours prior to surgery. Do NOT bring valuables to the hospital.  Leave suitcase in car.  After surgery it may be brought to your room.  For patients admitted to the hospital, checkout time is 11:00 AM the day of discharge.

## 2016-01-21 ENCOUNTER — Encounter (HOSPITAL_COMMUNITY)
Admission: RE | Admit: 2016-01-21 | Discharge: 2016-01-21 | Disposition: A | Discharge status: Home / Self Care | Payer: Managed Care, Other (non HMO) | Source: Ambulatory Visit | Attending: Obstetrics and Gynecology | Admitting: Obstetrics and Gynecology

## 2016-01-21 ENCOUNTER — Encounter (HOSPITAL_COMMUNITY): Payer: Self-pay

## 2016-01-21 HISTORY — DX: Gastro-esophageal reflux disease without esophagitis: K21.9

## 2016-01-21 LAB — CBC
HCT: 34.9 % — ABNORMAL LOW (ref 36.0–46.0)
Hemoglobin: 11.2 g/dL — ABNORMAL LOW (ref 12.0–15.0)
MCH: 25.9 pg — AB (ref 26.0–34.0)
MCHC: 32.1 g/dL (ref 30.0–36.0)
MCV: 80.8 fL (ref 78.0–100.0)
PLATELETS: 217 10*3/uL (ref 150–400)
RBC: 4.32 MIL/uL (ref 3.87–5.11)
RDW: 16.8 % — ABNORMAL HIGH (ref 11.5–15.5)
WBC: 7.9 10*3/uL (ref 4.0–10.5)

## 2016-01-22 LAB — RPR: RPR: NONREACTIVE

## 2016-01-23 ENCOUNTER — Encounter (HOSPITAL_COMMUNITY)
Admission: RE | Disposition: A | Discharge status: Home / Self Care | Payer: Self-pay | Source: Ambulatory Visit | Attending: Obstetrics and Gynecology

## 2016-01-23 ENCOUNTER — Encounter (HOSPITAL_COMMUNITY): Payer: Self-pay | Admitting: Emergency Medicine

## 2016-01-23 ENCOUNTER — Inpatient Hospital Stay (HOSPITAL_COMMUNITY): Payer: Managed Care, Other (non HMO) | Admitting: Anesthesiology

## 2016-01-23 ENCOUNTER — Inpatient Hospital Stay (HOSPITAL_COMMUNITY)
Admission: RE | Admit: 2016-01-23 | Discharge: 2016-01-25 | DRG: 765 | Disposition: A | Discharge status: Home / Self Care | Payer: Managed Care, Other (non HMO) | Source: Ambulatory Visit | Attending: Obstetrics and Gynecology | Admitting: Obstetrics and Gynecology

## 2016-01-23 DIAGNOSIS — Z6841 Body Mass Index (BMI) 40.0 and over, adult: Secondary | ICD-10-CM | POA: Diagnosis not present

## 2016-01-23 DIAGNOSIS — O34211 Maternal care for low transverse scar from previous cesarean delivery: Principal | ICD-10-CM | POA: Diagnosis present

## 2016-01-23 DIAGNOSIS — Z3A39 39 weeks gestation of pregnancy: Secondary | ICD-10-CM | POA: Diagnosis not present

## 2016-01-23 DIAGNOSIS — O9962 Diseases of the digestive system complicating childbirth: Secondary | ICD-10-CM | POA: Diagnosis present

## 2016-01-23 DIAGNOSIS — Z302 Encounter for sterilization: Secondary | ICD-10-CM

## 2016-01-23 DIAGNOSIS — O99214 Obesity complicating childbirth: Secondary | ICD-10-CM | POA: Diagnosis present

## 2016-01-23 DIAGNOSIS — Z8249 Family history of ischemic heart disease and other diseases of the circulatory system: Secondary | ICD-10-CM | POA: Diagnosis not present

## 2016-01-23 DIAGNOSIS — K219 Gastro-esophageal reflux disease without esophagitis: Secondary | ICD-10-CM | POA: Diagnosis present

## 2016-01-23 LAB — PREPARE RBC (CROSSMATCH)

## 2016-01-23 SURGERY — Surgical Case
Anesthesia: Spinal | Laterality: Bilateral

## 2016-01-23 MED ORDER — FERROUS SULFATE 325 (65 FE) MG PO TABS
325.0000 mg | ORAL_TABLET | Freq: Two times a day (BID) | ORAL | Status: DC
  Administered 2016-01-23 – 2016-01-24 (×3): 325 mg via ORAL
  Filled 2016-01-23 (×3): qty 1

## 2016-01-23 MED ORDER — BISACODYL 10 MG RE SUPP: 10.0000 mg | Freq: Every day | RECTAL | Status: DC | PRN

## 2016-01-23 MED ORDER — CEFAZOLIN SODIUM-DEXTROSE 2-3 GM-% IV SOLR: 2.0000 g | INTRAVENOUS | Status: DC

## 2016-01-23 MED ORDER — ONDANSETRON HCL 4 MG/2ML IJ SOLN: 4.0000 mg | Freq: Three times a day (TID) | INTRAMUSCULAR | Status: DC | PRN

## 2016-01-23 MED ORDER — DIPHENHYDRAMINE HCL 50 MG/ML IJ SOLN: 12.5000 mg | INTRAMUSCULAR | Status: DC | PRN

## 2016-01-23 MED ORDER — FLEET ENEMA 7-19 GM/118ML RE ENEM: 1.0000 | ENEMA | Freq: Every day | RECTAL | Status: DC | PRN

## 2016-01-23 MED ORDER — SENNOSIDES-DOCUSATE SODIUM 8.6-50 MG PO TABS
2.0000 | ORAL_TABLET | ORAL | Status: DC
  Administered 2016-01-24 – 2016-01-25 (×2): 2 via ORAL
  Filled 2016-01-23 (×2): qty 2

## 2016-01-23 MED ORDER — KETOROLAC TROMETHAMINE 30 MG/ML IJ SOLN: 30.0000 mg | Freq: Four times a day (QID) | INTRAMUSCULAR | Status: DC | PRN

## 2016-01-23 MED ORDER — ACETAMINOPHEN 500 MG PO TABS
1000.0000 mg | ORAL_TABLET | Freq: Four times a day (QID) | ORAL | Status: AC
  Administered 2016-01-23 – 2016-01-24 (×4): 1000 mg via ORAL
  Filled 2016-01-23 (×4): qty 2

## 2016-01-23 MED ORDER — OXYTOCIN 10 UNIT/ML IJ SOLN
40.0000 [IU] | INTRAMUSCULAR | Status: DC | PRN
  Administered 2016-01-23: 40 [IU] via INTRAVENOUS

## 2016-01-23 MED ORDER — DIPHENHYDRAMINE HCL 25 MG PO CAPS: 25.0000 mg | ORAL_CAPSULE | ORAL | Status: DC | PRN

## 2016-01-23 MED ORDER — SCOPOLAMINE 1 MG/3DAYS TD PT72: 1.0000 | MEDICATED_PATCH | Freq: Once | TRANSDERMAL | Status: DC

## 2016-01-23 MED ORDER — PHENYLEPHRINE 40 MCG/ML (10ML) SYRINGE FOR IV PUSH (FOR BLOOD PRESSURE SUPPORT)
PREFILLED_SYRINGE | INTRAVENOUS | Status: AC
  Filled 2016-01-23: qty 10

## 2016-01-23 MED ORDER — ZOLPIDEM TARTRATE 5 MG PO TABS: 5.0000 mg | ORAL_TABLET | Freq: Every evening | ORAL | Status: DC | PRN

## 2016-01-23 MED ORDER — MEPERIDINE HCL 25 MG/ML IJ SOLN: 6.2500 mg | INTRAMUSCULAR | Status: DC | PRN

## 2016-01-23 MED ORDER — NALBUPHINE HCL 10 MG/ML IJ SOLN
5.0000 mg | Freq: Once | INTRAMUSCULAR | Status: AC | PRN
  Administered 2016-01-23: 5 mg via SUBCUTANEOUS

## 2016-01-23 MED ORDER — OXYCODONE HCL 5 MG PO TABS: 10.0000 mg | ORAL_TABLET | ORAL | Status: DC | PRN

## 2016-01-23 MED ORDER — KETOROLAC TROMETHAMINE 30 MG/ML IJ SOLN
INTRAMUSCULAR | Status: AC
  Administered 2016-01-23: 30 mg via INTRAMUSCULAR
  Filled 2016-01-23: qty 1

## 2016-01-23 MED ORDER — LANOLIN HYDROUS EX OINT: 1.0000 "application " | TOPICAL_OINTMENT | CUTANEOUS | Status: DC | PRN

## 2016-01-23 MED ORDER — NALOXONE HCL 0.4 MG/ML IJ SOLN: 0.4000 mg | INTRAMUSCULAR | Status: DC | PRN

## 2016-01-23 MED ORDER — SCOPOLAMINE 1 MG/3DAYS TD PT72
1.0000 | MEDICATED_PATCH | Freq: Once | TRANSDERMAL | Status: DC
  Administered 2016-01-23: 1.5 mg via TRANSDERMAL

## 2016-01-23 MED ORDER — SIMETHICONE 80 MG PO CHEW
80.0000 mg | CHEWABLE_TABLET | ORAL | Status: DC
  Administered 2016-01-24 – 2016-01-25 (×2): 80 mg via ORAL
  Filled 2016-01-23 (×2): qty 1

## 2016-01-23 MED ORDER — PRENATAL MULTIVITAMIN CH
1.0000 | ORAL_TABLET | Freq: Every day | ORAL | Status: DC
  Administered 2016-01-24 – 2016-01-25 (×2): 1 via ORAL
  Filled 2016-01-23 (×2): qty 1

## 2016-01-23 MED ORDER — SODIUM CHLORIDE 0.9% FLUSH: 3.0000 mL | Freq: Two times a day (BID) | INTRAVENOUS | Status: DC

## 2016-01-23 MED ORDER — LACTATED RINGERS IV SOLN
INTRAVENOUS | Status: DC | PRN
  Administered 2016-01-23: 08:00:00 via INTRAVENOUS

## 2016-01-23 MED ORDER — FENTANYL CITRATE (PF) 100 MCG/2ML IJ SOLN
INTRAMUSCULAR | Status: AC
  Filled 2016-01-23: qty 2

## 2016-01-23 MED ORDER — CEFAZOLIN SODIUM-DEXTROSE 2-3 GM-% IV SOLR
INTRAVENOUS | Status: AC
  Filled 2016-01-23: qty 50

## 2016-01-23 MED ORDER — LIDOCAINE-EPINEPHRINE (PF) 2 %-1:200000 IJ SOLN
INTRAMUSCULAR | Status: AC
  Filled 2016-01-23: qty 20

## 2016-01-23 MED ORDER — METHYLERGONOVINE MALEATE 0.2 MG/ML IJ SOLN: 0.2000 mg | INTRAMUSCULAR | Status: DC | PRN

## 2016-01-23 MED ORDER — MORPHINE SULFATE (PF) 0.5 MG/ML IJ SOLN
INTRAMUSCULAR | Status: AC
  Filled 2016-01-23: qty 10

## 2016-01-23 MED ORDER — BUPIVACAINE HCL (PF) 0.25 % IJ SOLN
INTRAMUSCULAR | Status: AC
  Filled 2016-01-23: qty 30

## 2016-01-23 MED ORDER — PROMETHAZINE HCL 25 MG/ML IJ SOLN: 6.2500 mg | INTRAMUSCULAR | Status: DC | PRN

## 2016-01-23 MED ORDER — MENTHOL 3 MG MT LOZG: 1.0000 | LOZENGE | OROMUCOSAL | Status: DC | PRN

## 2016-01-23 MED ORDER — NALBUPHINE HCL 10 MG/ML IJ SOLN
INTRAMUSCULAR | Status: AC
  Filled 2016-01-23: qty 1

## 2016-01-23 MED ORDER — KETOROLAC TROMETHAMINE 30 MG/ML IJ SOLN
30.0000 mg | Freq: Four times a day (QID) | INTRAMUSCULAR | Status: DC | PRN
  Administered 2016-01-23: 30 mg via INTRAMUSCULAR

## 2016-01-23 MED ORDER — LACTATED RINGERS IV SOLN
Freq: Once | INTRAVENOUS | Status: AC
  Administered 2016-01-23: 07:00:00 via INTRAVENOUS

## 2016-01-23 MED ORDER — IBUPROFEN 600 MG PO TABS
600.0000 mg | ORAL_TABLET | Freq: Four times a day (QID) | ORAL | Status: DC
  Administered 2016-01-23 – 2016-01-25 (×8): 600 mg via ORAL
  Filled 2016-01-23 (×8): qty 1

## 2016-01-23 MED ORDER — BUPIVACAINE HCL (PF) 0.25 % IJ SOLN
INTRAMUSCULAR | Status: DC | PRN
  Administered 2016-01-23: 20 mL

## 2016-01-23 MED ORDER — SODIUM CHLORIDE 0.9% FLUSH: 3.0000 mL | INTRAVENOUS | Status: DC | PRN

## 2016-01-23 MED ORDER — 0.9 % SODIUM CHLORIDE (POUR BTL) OPTIME
TOPICAL | Status: DC | PRN
  Administered 2016-01-23: 500 mL

## 2016-01-23 MED ORDER — PHENYLEPHRINE 8 MG IN D5W 100 ML (0.08MG/ML) PREMIX OPTIME
INJECTION | INTRAVENOUS | Status: DC | PRN
  Administered 2016-01-23: 60 ug/min via INTRAVENOUS

## 2016-01-23 MED ORDER — ONDANSETRON HCL 4 MG/2ML IJ SOLN
INTRAMUSCULAR | Status: AC
  Filled 2016-01-23: qty 2

## 2016-01-23 MED ORDER — SODIUM BICARBONATE 8.4 % IV SOLN
INTRAVENOUS | Status: AC
Start: 2016-01-23 — End: 2016-01-23
  Filled 2016-01-23: qty 50

## 2016-01-23 MED ORDER — FENTANYL CITRATE (PF) 100 MCG/2ML IJ SOLN
INTRAMUSCULAR | Status: DC | PRN
  Administered 2016-01-23: 20 ug via INTRATHECAL
  Administered 2016-01-23: 100 ug via INTRAVENOUS
  Administered 2016-01-23: 80 ug via INTRAVENOUS

## 2016-01-23 MED ORDER — SCOPOLAMINE 1 MG/3DAYS TD PT72
MEDICATED_PATCH | TRANSDERMAL | Status: AC
  Administered 2016-01-23: 1.5 mg via TRANSDERMAL
  Filled 2016-01-23: qty 1

## 2016-01-23 MED ORDER — NALBUPHINE HCL 10 MG/ML IJ SOLN: 5.0000 mg | INTRAMUSCULAR | Status: DC | PRN

## 2016-01-23 MED ORDER — DEXTROSE 5 % IV SOLN
3.0000 g | Freq: Once | INTRAVENOUS | Status: AC
  Administered 2016-01-23: 3 g via INTRAVENOUS
  Filled 2016-01-23: qty 3000

## 2016-01-23 MED ORDER — SIMETHICONE 80 MG PO CHEW: 80.0000 mg | CHEWABLE_TABLET | ORAL | Status: DC | PRN

## 2016-01-23 MED ORDER — LACTATED RINGERS IV SOLN
INTRAVENOUS | Status: DC
  Administered 2016-01-23 (×2): via INTRAVENOUS

## 2016-01-23 MED ORDER — MORPHINE SULFATE (PF) 0.5 MG/ML IJ SOLN
INTRAMUSCULAR | Status: DC | PRN
  Administered 2016-01-23: .2 mg via INTRATHECAL

## 2016-01-23 MED ORDER — FENTANYL CITRATE (PF) 100 MCG/2ML IJ SOLN: 25.0000 ug | INTRAMUSCULAR | Status: DC | PRN

## 2016-01-23 MED ORDER — OXYTOCIN 10 UNIT/ML IJ SOLN
INTRAMUSCULAR | Status: AC
  Filled 2016-01-23: qty 4

## 2016-01-23 MED ORDER — NALOXONE HCL 2 MG/2ML IJ SOSY
1.0000 ug/kg/h | PREFILLED_SYRINGE | INTRAVENOUS | Status: DC | PRN
  Filled 2016-01-23: qty 2

## 2016-01-23 MED ORDER — NALBUPHINE HCL 10 MG/ML IJ SOLN: 5.0000 mg | Freq: Once | INTRAMUSCULAR | Status: AC | PRN

## 2016-01-23 MED ORDER — ONDANSETRON HCL 4 MG/2ML IJ SOLN
INTRAMUSCULAR | Status: DC | PRN
  Administered 2016-01-23: 4 mg via INTRAVENOUS

## 2016-01-23 MED ORDER — BUPIVACAINE IN DEXTROSE 0.75-8.25 % IT SOLN
INTRATHECAL | Status: DC | PRN
  Administered 2016-01-23: 1.7 mL via INTRATHECAL

## 2016-01-23 MED ORDER — WITCH HAZEL-GLYCERIN EX PADS: 1.0000 "application " | MEDICATED_PAD | CUTANEOUS | Status: DC | PRN

## 2016-01-23 MED ORDER — SODIUM CHLORIDE 0.9 % IV SOLN: 250.0000 mL | INTRAVENOUS | Status: DC

## 2016-01-23 MED ORDER — SIMETHICONE 80 MG PO CHEW
80.0000 mg | CHEWABLE_TABLET | Freq: Three times a day (TID) | ORAL | Status: DC
  Administered 2016-01-23 – 2016-01-25 (×6): 80 mg via ORAL
  Filled 2016-01-23 (×6): qty 1

## 2016-01-23 MED ORDER — DIPHENHYDRAMINE HCL 25 MG PO CAPS: 25.0000 mg | ORAL_CAPSULE | Freq: Four times a day (QID) | ORAL | Status: DC | PRN

## 2016-01-23 MED ORDER — METHYLERGONOVINE MALEATE 0.2 MG PO TABS: 0.2000 mg | ORAL_TABLET | ORAL | Status: DC | PRN

## 2016-01-23 MED ORDER — LACTATED RINGERS IV SOLN
INTRAVENOUS | Status: DC | PRN
  Administered 2016-01-23: 07:00:00 via INTRAVENOUS

## 2016-01-23 MED ORDER — DIBUCAINE 1 % RE OINT: 1.0000 "application " | TOPICAL_OINTMENT | RECTAL | Status: DC | PRN

## 2016-01-23 MED ORDER — OXYCODONE HCL 5 MG PO TABS: 5.0000 mg | ORAL_TABLET | ORAL | Status: DC | PRN

## 2016-01-23 MED ORDER — OXYTOCIN 10 UNIT/ML IJ SOLN: 2.5000 [IU]/h | INTRAVENOUS | Status: AC

## 2016-01-23 SURGICAL SUPPLY — 48 items
BAG DECANTER FOR FLEXI CONT (MISCELLANEOUS) ×3 IMPLANT
BARRIER ADHS 3X4 INTERCEED (GAUZE/BANDAGES/DRESSINGS) ×3 IMPLANT
BENZOIN TINCTURE PRP APPL 2/3 (GAUZE/BANDAGES/DRESSINGS) IMPLANT
BLADE 10 SAFETY STRL DISP (BLADE) ×3 IMPLANT
CLAMP CORD UMBIL (MISCELLANEOUS) IMPLANT
CLOSURE WOUND 1/2 X4 (GAUZE/BANDAGES/DRESSINGS)
CLOTH BEACON ORANGE TIMEOUT ST (SAFETY) ×3 IMPLANT
CONTAINER PREFILL 10% NBF 15ML (MISCELLANEOUS) IMPLANT
DRAPE C SECTION CLR SCREEN (DRAPES) ×3 IMPLANT
DRAPE SHEET LG 3/4 BI-LAMINATE (DRAPES) IMPLANT
DRSG OPSITE POSTOP 4X10 (GAUZE/BANDAGES/DRESSINGS) ×3 IMPLANT
DURAPREP 26ML APPLICATOR (WOUND CARE) ×3 IMPLANT
ELECT REM PT RETURN 9FT ADLT (ELECTROSURGICAL) ×3
ELECTRODE REM PT RTRN 9FT ADLT (ELECTROSURGICAL) ×1 IMPLANT
EXTRACTOR VACUUM M CUP 4 TUBE (SUCTIONS) IMPLANT
EXTRACTOR VACUUM M CUP 4' TUBE (SUCTIONS)
GLOVE BIOGEL PI IND STRL 7.0 (GLOVE) ×2 IMPLANT
GLOVE BIOGEL PI INDICATOR 7.0 (GLOVE) ×4
GLOVE ECLIPSE 6.5 STRL STRAW (GLOVE) ×3 IMPLANT
GOWN STRL REUS W/TWL LRG LVL3 (GOWN DISPOSABLE) ×6 IMPLANT
KIT ABG SYR 3ML LUER SLIP (SYRINGE) IMPLANT
NEEDLE HYPO 22GX1.5 SAFETY (NEEDLE) ×3 IMPLANT
NEEDLE HYPO 25X5/8 SAFETYGLIDE (NEEDLE) IMPLANT
NS IRRIG 1000ML POUR BTL (IV SOLUTION) ×6 IMPLANT
PACK C SECTION WH (CUSTOM PROCEDURE TRAY) ×3 IMPLANT
PAD OB MATERNITY 4.3X12.25 (PERSONAL CARE ITEMS) ×3 IMPLANT
RTRCTR C-SECT PINK 25CM LRG (MISCELLANEOUS) IMPLANT
SPONGE LAP 18X18 X RAY DECT (DISPOSABLE) ×12 IMPLANT
STAPLER VISISTAT 35W (STAPLE) ×3 IMPLANT
STRIP CLOSURE SKIN 1/2X4 (GAUZE/BANDAGES/DRESSINGS) IMPLANT
SUT CHROMIC GUT AB #0 18 (SUTURE) ×3 IMPLANT
SUT MNCRL 0 VIOLET CTX 36 (SUTURE) ×3 IMPLANT
SUT MON AB 2-0 SH 27 (SUTURE)
SUT MON AB 2-0 SH27 (SUTURE) IMPLANT
SUT MON AB 3-0 SH 27 (SUTURE)
SUT MON AB 3-0 SH27 (SUTURE) IMPLANT
SUT MON AB 4-0 PS1 27 (SUTURE) IMPLANT
SUT MONOCRYL 0 CTX 36 (SUTURE) ×6
SUT PLAIN 2 0 (SUTURE)
SUT PLAIN 2 0 XLH (SUTURE) ×3 IMPLANT
SUT PLAIN ABS 2-0 CT1 27XMFL (SUTURE) IMPLANT
SUT VIC AB 0 CT1 36 (SUTURE) ×6 IMPLANT
SUT VIC AB 2-0 CT1 27 (SUTURE) ×6
SUT VIC AB 2-0 CT1 TAPERPNT 27 (SUTURE) ×3 IMPLANT
SUT VIC AB 4-0 PS2 27 (SUTURE) IMPLANT
SYR CONTROL 10ML LL (SYRINGE) ×3 IMPLANT
TOWEL OR 17X24 6PK STRL BLUE (TOWEL DISPOSABLE) ×3 IMPLANT
TRAY FOLEY CATH SILVER 14FR (SET/KITS/TRAYS/PACK) IMPLANT

## 2016-01-23 NOTE — Transfer of Care (Signed)
Immediate Anesthesia Transfer of Care Note  Patient: Shelia Tucker  Procedure(s) Performed: Procedure(s) with comments: Repeat CESAREAN SECTION WITH BILATERAL TUBAL LIGATION (Bilateral) - EDD: 01/30/16  Patient Location: PACU  Anesthesia Type:Spinal  Level of Consciousness: awake, alert  and oriented  Airway & Oxygen Therapy: Patient Spontanous Breathing  Post-op Assessment: Report given to RN and Post -op Vital signs reviewed and stable  Post vital signs: Reviewed and stable  Last Vitals:  Filed Vitals:   01/23/16 0605  BP: 128/79  Pulse: 82  Temp: 36.7 C  Resp: 18    Complications: No apparent anesthesia complications

## 2016-01-23 NOTE — Anesthesia Preprocedure Evaluation (Addendum)
Anesthesia Evaluation  Patient identified by MRN, date of birth, ID band Patient awake    Reviewed: Allergy & Precautions, NPO status , Patient's Chart, lab work & pertinent test results  Airway Mallampati: IV  TM Distance: >3 FB Neck ROM: Full    Dental   Pulmonary neg pulmonary ROS,    breath sounds clear to auscultation       Cardiovascular negative cardio ROS   Rhythm:Regular Rate:Normal     Neuro/Psych negative neurological ROS     GI/Hepatic Neg liver ROS, GERD  ,  Endo/Other  Morbid obesity  Renal/GU Renal diseaseSolitary kidney     Musculoskeletal   Abdominal   Peds  Hematology negative hematology ROS (+)   Anesthesia Other Findings   Reproductive/Obstetrics                            Lab Results  Component Value Date   WBC 7.9 01/21/2016   HGB 11.2* 01/21/2016   HCT 34.9* 01/21/2016   MCV 80.8 01/21/2016   PLT 217 01/21/2016   No results found for: CREATININE, BUN, NA, K, CL, CO2  Anesthesia Physical Anesthesia Plan  ASA: III  Anesthesia Plan: Spinal   Post-op Pain Management:    Induction: Intravenous  Airway Management Planned: Natural Airway and Simple Face Mask  Additional Equipment:   Intra-op Plan:   Post-operative Plan:   Informed Consent: I have reviewed the patients History and Physical, chart, labs and discussed the procedure including the risks, benefits and alternatives for the proposed anesthesia with the patient or authorized representative who has indicated his/her understanding and acceptance.     Plan Discussed with: CRNA  Anesthesia Plan Comments:         Anesthesia Quick Evaluation

## 2016-01-23 NOTE — Addendum Note (Signed)
Addendum  created 01/23/16 1605 by Algis Greenhouse, CRNA   Modules edited: Charges VN, Clinical Notes   Clinical Notes:  File: [id number]

## 2016-01-23 NOTE — Brief Op Note (Signed)
01/23/2016  9:28 AM  PATIENT:  Shelia Tucker  40 y.o. female  PRE-OPERATIVE DIAGNOSIS:  Previous Cesarean Section, Desires Sterilization, term gestation  POST-OPERATIVE DIAGNOSIS:  Previous Cesarean Section, Desires Sterilization, term gestation  PROCEDURE:  Repeat Cesarean section,  kerr hysterotomy, Modified Pomeroy tubal ligation  SURGEON:  Surgeon(s) and Role:    * IT trainer, MD - Primary  PHYSICIAN ASSISTANT:   ASSISTANTS: Marlinda Mike, CNM   ANESTHESIA:   spinal FINDINGS; Live female ROT, Apgar 8/8 , nl tubes, polycystic ovaries, post placenta EBL:  Total I/O In: 2700 [I.V.:2700] Out: 1500 [Urine:250; Blood:1250]  BLOOD ADMINISTERED:none  DRAINS: none   LOCAL MEDICATIONS USED:  MARCAINE     SPECIMEN:  Source of Specimen:  portion of right and left tube  DISPOSITION OF SPECIMEN:  PATHOLOGY  COUNTS:  YES  TOURNIQUET:  * No tourniquets in log *  DICTATION: .Other Dictation: Dictation Number [id number]  PLAN OF CARE: Admit to inpatient   PATIENT DISPOSITION:  PACU - hemodynamically stable.   Delay start of Pharmacological VTE agent (>24hrs) due to surgical blood loss or risk of bleeding: no

## 2016-01-23 NOTE — Anesthesia Postprocedure Evaluation (Signed)
Anesthesia Post Note  Patient: Designer, industrial/product  Procedure(s) Performed: Procedure(s) (LRB): Repeat CESAREAN SECTION WITH BILATERAL TUBAL LIGATION (Bilateral)  Patient location during evaluation: PACU Anesthesia Type: Spinal Level of consciousness: awake and alert Pain management: pain level controlled Vital Signs Assessment: post-procedure vital signs reviewed and stable Respiratory status: spontaneous breathing Cardiovascular status: blood pressure returned to baseline Anesthetic complications: no    Last Vitals:  Filed Vitals:   01/23/16 1000 01/23/16 1015  BP: 102/77 117/67  Pulse: 63 65  Temp:  36.3 C  Resp: 15 17    Last Pain:  Filed Vitals:   01/23/16 1023  PainSc: 0-No pain      LLE Sensation: Tingling (01/23/16 1015)   RLE Sensation: Tingling (01/23/16 1015)      Kennieth Rad

## 2016-01-23 NOTE — Anesthesia Procedure Notes (Signed)
Spinal Patient location during procedure: OR Staffing Anesthesiologist: Hartleigh Edmonston Performed by: anesthesiologist  Preanesthetic Checklist Completed: patient identified, site marked, surgical consent, pre-op evaluation, timeout performed, IV checked, risks and benefits discussed and monitors and equipment checked Spinal Block Patient position: sitting Prep: DuraPrep Patient monitoring: heart rate, continuous pulse ox and blood pressure Location: L4-5 Injection technique: single-shot Needle Needle type: Pencan  Needle gauge: 24 G Needle length: 9 cm Assessment Sensory level: T4 Additional Notes Functioning IV was confirmed and monitors were applied. Sterile prep and drape, including hand hygiene, mask and sterile gloves were used. The patient was positioned and the spine was prepped. The skin was anesthetized with lidocaine.  Free flow of clear CSF was obtained prior to injecting local anesthetic into the CSF.  The spinal needle aspirated freely following injection.  The needle was carefully withdrawn.  The patient tolerated the procedure well. Consent was obtained prior to procedure with all questions answered and concerns addressed.  Ben Lincy Belles, MD    

## 2016-01-23 NOTE — Lactation Note (Signed)
This note was copied from the chart of Shelia Naphtali Riede. Lactation Consultation Note Initial visit at 9 hours of age.  Mom reports several good feedings and denies pain.  Mom is experienced with older child breastfeeding for 8 months.  Baby asleep in visitors arms.  [facility] LC resources given and discussed.  Encouraged to feed with early cues on demand.  Early newborn behavior discussed.  Hand expression reported by mom  with colostrum visible.  Mom to call for assist as needed.    Patient Name: Shelia Tucker Date: 01/23/2016 Reason for consult: Initial assessment   Maternal Data Has patient been taught Hand Expression?: Yes Does the patient have breastfeeding experience prior to this delivery?: Yes  Feeding Feeding Type: Breast Milk Length of feed: 10 min  LATCH Score/Interventions                Intervention(s): Breastfeeding basics reviewed;Support Pillows;Skin to skin     Lactation Tools Discussed/Used     Consult Status Consult Status: Follow-up Date: 01/24/16 Follow-up type: In-patient    Shoptaw, Arvella Merles 01/23/2016, 5:46 PM

## 2016-01-23 NOTE — Anesthesia Postprocedure Evaluation (Signed)
Anesthesia Post Note  Patient: Shelia Tucker  Procedure(s) Performed: Procedure(s) (LRB): Repeat CESAREAN SECTION WITH BILATERAL TUBAL LIGATION (Bilateral)  Patient location during evaluation: Mother Baby Anesthesia Type: Spinal Level of consciousness: awake Pain management: satisfactory to patient Vital Signs Assessment: post-procedure vital signs reviewed and stable Respiratory status: spontaneous breathing Cardiovascular status: stable Anesthetic complications: no    Last Vitals:  Filed Vitals:   01/23/16 1300 01/23/16 1400  BP: 102/58 98/43  Pulse: 59 61  Temp: 36.4 C 36.3 C  Resp: 18 18    Last Pain:  Filed Vitals:   01/23/16 1425  PainSc: 0-No pain                 [name]

## 2016-01-24 ENCOUNTER — Encounter (HOSPITAL_COMMUNITY): Payer: Self-pay | Admitting: Obstetrics and Gynecology

## 2016-01-24 LAB — CBC
HEMATOCRIT: 26.8 % — AB (ref 36.0–46.0)
HEMOGLOBIN: 8.6 g/dL — AB (ref 12.0–15.0)
MCH: 26.1 pg (ref 26.0–34.0)
MCHC: 32.1 g/dL (ref 30.0–36.0)
MCV: 81.2 fL (ref 78.0–100.0)
Platelets: 193 10*3/uL (ref 150–400)
RBC: 3.3 MIL/uL — AB (ref 3.87–5.11)
RDW: 16.7 % — ABNORMAL HIGH (ref 11.5–15.5)
WBC: 11.7 10*3/uL — ABNORMAL HIGH (ref 4.0–10.5)

## 2016-01-24 MED ORDER — ACETAMINOPHEN 500 MG PO TABS
1000.0000 mg | ORAL_TABLET | Freq: Four times a day (QID) | ORAL | Status: DC | PRN
  Administered 2016-01-24: 1000 mg via ORAL
  Filled 2016-01-24 (×2): qty 2

## 2016-01-24 MED ORDER — HYDROCHLOROTHIAZIDE 25 MG PO TABS
25.0000 mg | ORAL_TABLET | Freq: Every day | ORAL | Status: DC
  Administered 2016-01-24 – 2016-01-25 (×2): 25 mg via ORAL
  Filled 2016-01-24 (×3): qty 1

## 2016-01-24 MED ORDER — MAGNESIUM 200 MG PO TABS
200.0000 mg | ORAL_TABLET | Freq: Every day | ORAL | Status: DC
Start: 2016-01-24 — End: 2016-01-25
  Administered 2016-01-24 – 2016-01-25 (×2): 200 mg via ORAL
  Filled 2016-01-24 (×3): qty 1

## 2016-01-24 NOTE — Lactation Note (Signed)
This note was copied from the chart of Shelia Matylda Fehring. Lactation Consultation Note  Patient Name: Shelia Tucker [id number] Date: 01/24/2016 Reason for consult: Follow-up assessment  With this term baby, now 62 hours old and cluster feeding. Mom has easily expressed colostrum, baby latches easily, and mom denies any discomfort. The baby had a large weight loss at 15 hours of birth, and I told mom I was comfortable with how she and the baby were doing with feeding, and we will see what her weight is this evening to further evaluate . Mom knows to call for questions/concerns.    Maternal Data    Feeding Feeding Type: Breast Fed  LATCH Score/Interventions Latch: Grasps breast easily, tongue down, lips flanged, rhythmical sucking.  Audible Swallowing: A few with stimulation Intervention(s): Hand expression  Type of Nipple: Everted at rest and after stimulation  Comfort (Breast/Nipple): Soft / non-tender     Hold (Positioning): Assistance needed to correctly position infant at breast and maintain latch. Intervention(s): Breastfeeding basics reviewed;Support Pillows;Position options  LATCH Score: 8  Lactation Tools Discussed/Used     Consult Status Consult Status: Follow-up Date: 01/25/16 Follow-up type: In-patient    Alfred Levins 01/24/2016, 3:40 PM

## 2016-01-24 NOTE — Progress Notes (Signed)
Patient ID: Shelia Tucker, female   DOB: 04/03/1976, 40 y.o.   MRN: [id number] Subjective: S/P Repeat Cesarean Delivery POD# 1 Information for the patient's newborn:  Suezette, Lafave Girl Dezyrae [[id number]]  female   Reports feeling well. Feeding: breast Patient reports tolerating PO.  Breast symptoms: none Pain controlled with ibuprofen (OTC) and narcotic analgesics including Percocet Denies HA/SOB/C/P/N/V/dizziness. Flatus absent. No BM. She reports vaginal bleeding as normal, without clots.  She is ambulating, urinating without difficult.     Objective:   VS:  Filed Vitals:   01/23/16 1952 01/24/16 0026 01/24/16 0334 01/24/16 0824  BP: 104/53 101/64 110/70 109/66  Pulse: 59 62 65 70  Temp: 98 F (36.7 C) 97.9 F (36.6 C) 97.9 F (36.6 C) 98.2 F (36.8 C)  TempSrc: Oral Oral Oral Oral  Resp: SpO2: 98% 99% 99% 100%     Intake/Output Summary (Last 24 hours) at 01/24/16 1142 Last data filed at 01/24/16 0630  Gross per 24 hour  Intake    125 ml  Output   1650 ml  Net  -1525 ml        Recent Labs  01/24/16 0648  WBC 11.7*  HGB 8.6*  HCT 26.8*  PLT 193     Blood type: --/--/O POS (01/30 1000)  Rubella: Immune (07/18 0000)     Physical Exam:   General: alert, cooperative, no distress and moderately obese  CV: Regular rate and rhythm, S1S2 present or without murmur or extra heart sounds  Resp: clear  Abdomen: soft, nontender, hypoactive bowel sounds  Incision: clean, dry, intact and skin well-approximated with staples  Uterine Fundus: firm, 1 FB below umbilicus, nontender  Lochia: minimal  Ext: edema 3+ and Homans sign is negative, no sign of DVT   Assessment/Plan: 40 y.o.   POD# 1.  S/P Cesarean Delivery.  Indications: repeat                Active Problems:   Postpartum care following cesarean delivery (2/1)  Doing well, stable.               Regular diet as tolerated Start HCTZ 25 mg x 5 days Start Magnesium Oxide 200 mg  daily Ambulate TID in hallways Increase water intake until urine is clear Routine post-op care  Kenard Gower, MSN, CNM 01/24/2016, 11:20 AM

## 2016-01-24 NOTE — Lactation Note (Signed)
This note was copied from the chart of Shelia Maryl Blalock. Lactation Consultation Note Experienced mom BF for 8 months, baby has been BF well for long feeds. Has had 5 stools and 2 voids in 17 hours of life. I feel that may have contributed to the 6% weight loss. Monitoring weight lose, I&O, will cont.  Patient Name: Shelia Tucker GUYQI'H Date: 01/24/2016 Reason for consult: Follow-up assessment;Infant weight loss   Maternal Data    Feeding Feeding Type: Breast Fed Length of feed: 60 min  LATCH Score/Interventions                      Lactation Tools Discussed/Used     Consult Status Consult Status: Follow-up Date: 01/24/16 Follow-up type: In-patient    Jaimie Redditt, Diamond Nickel 01/24/2016, 1:14 AM

## 2016-01-24 NOTE — Op Note (Signed)
NAMEMARGARET, COCKERILL NO.:  [id number]  MEDICAL RECORD NO.:  [id number]  LOCATION:  9145                          FACILITY:  [facility]  PHYSICIAN:  Maxie Better, M.D.DATE OF BIRTH:  03-May-1976  DATE OF PROCEDURE:  01/23/2016 DATE OF DISCHARGE:                              OPERATIVE REPORT   PREOPERATIVE DIAGNOSES:  Previous cesarean section, term gestation, desires sterilization.  PROCEDURE:  Repeat cesarean section, Sharl Ma hysterotomy, modified Pomeroy tubal ligation.  POSTOPERATIVE DIAGNOSES:  Previous cesarean section, term gestation, desires sterilization.  ANESTHESIA:  Spinal.  SURGEON:  Maxie Better, M.D.  ASSISTANT:  Marlinda Mike, CNM  DESCRIPTION OF PROCEDURE:  Under adequate spinal anesthesia, the patient was placed in the supine position with a left lateral tilt.  She was sterilely prepped and draped in usual fashion.  An indwelling Foley catheter was sterilely placed.  A 0.25% Marcaine was injected along the previous Pfannenstiel skin incision.  Pfannenstiel skin incision was then made, carried down to the rectus fascia.  Rectus fascia was opened transversely.  Rectus fascia was then bluntly and sharply dissected off the rectus muscle in the superior and inferior fashion.  The rectus muscle was sharply separated.  The parietal peritoneum was then entered and extended.  As part of the opening of the parietal peritoneum, the omentum was noted to be adherent to the anterior abdominal wall.  The entry into the parietal peritoneum was then made more laterally and extended.  At that point, a self-retaining Alexis retractor was then placed.  The bladder had thin lower uterine segment, adherent to the lower uterine segment.  Multiple vessels were noted throughout the lower segment.  Initial attempt to open the transverse peritoneum was limited by the adhesions.  Superior to that, an incision was then carefully made through a thin lower uterine  segment and extended bandage scissors. Collection of amniotic fluid was done.  The excision was then carefully extended.  A floating vertex was encountered in a right transverse position.  Attempted delivery was unsuccessful.  Therefore, vacuum was subsequently applied with subsequent delivery of a live female from the left occiput posterior presentation.  Baby was bulb suctioned on the abdomen.  The cord was clamped, cut.  The baby was transferred to the awaiting pediatrician, who assigned Apgars of 8 and 8 at 1 and 5 minutes.  The placenta was posterior and manually removed.  Uterine cavity was cleaned of debris.  Uterine incision had no extension.  It was closed with an of 0 Monocryl running lock stitch.  There bladder peritoneum was then opened transversely and inferiorly.  The bleeders were encountered one on the left angle, resulted in a layer-like stitch being placed.  Subsequently, bleeding also was noted on the right angle. Initial clamping of that area was free tie with 2-0 Vicryl suture. Continued bleeding noted, then resulted in a layer like stitch placement, displacing the bowels back up posteriorly.  Bleeding along the inferior aspect of the uterus itself resulted in several figure-of- eight sutures also being placed.  Once hemostasis was finally achieved. The tubes and ovaries were noted and inspected.  Both tubes were normal. Both ovaries suggests polycystic ovaries.  The midportion of both fallopian tubes  are grasped with a Babcock and the underlying mesosalpinx bilaterally was opened.  The intervening segment of tube was tied with 0 chromic suture, proximally distally x2 on both sides and intervening segment of both fallopian tubes were then removed.  The uterine incision was then carefully inspected.  Good hemostasis still noted.  Bleeding along the peritoneal edges were cauterized.  An omental adhesion was noted after the Alexis retractor was then removed. Interceed  had been placed on the lower uterine segment.  The omental adhesions were lysed off the anterior wall as it was looped along the wall.  Once this omentum was freed, the parietal peritoneum was then closed with 2-0 Vicryl.  The rectus fascia, the muscle was distally was carefully approximated and then the rectus fascia was closed with 0 Vicryl x2.  The subcutaneous area was irrigated, small bleeders cauterized.  Interrupted 2-0 plain sutures placed and the skin approximated with Ethicon staples.  SPECIMEN:  Portion of right and left fallopian tubes sent to Pathology.  ESTIMATED BLOOD LOSS:  800 mL.  INTRAOPERATIVE FLUID:  2700 mL.  URINE OUTPUT:  250 mL.  COUNTS:  Sponge and instrument counts x2 was correct.  COMPLICATION:  None.  The patient tolerated the procedure well and was transferred to recovery in stable condition.     Maxie Better, M.D.     Goodman/MEDQ  D:  01/24/2016  T:  01/24/2016  Job:  [id number]

## 2016-01-25 LAB — TYPE AND SCREEN
ABO/RH(D): O POS
Antibody Screen: NEGATIVE
UNIT DIVISION: 0
UNIT DIVISION: 0
Unit division: 0
Unit division: 0

## 2016-01-25 MED ORDER — IBUPROFEN 600 MG PO TABS: 600.0000 mg | ORAL_TABLET | Freq: Four times a day (QID) | ORAL | Status: AC

## 2016-01-25 MED ORDER — MAGNESIUM 200 MG PO TABS: 200.0000 mg | ORAL_TABLET | Freq: Every day | ORAL | Status: AC

## 2016-01-25 MED ORDER — HYDROCHLOROTHIAZIDE 25 MG PO TABS: 25.0000 mg | ORAL_TABLET | Freq: Every day | ORAL | Status: AC

## 2016-01-25 MED ORDER — OXYCODONE HCL 10 MG PO TABS: 10.0000 mg | ORAL_TABLET | ORAL | Status: AC | PRN

## 2016-01-25 NOTE — Lactation Note (Signed)
This note was copied from the chart of Shelia Tucker. Lactation Consultation Note  Patient Name: Shelia Tucker [id number]'[id number] Date: 01/25/2016 Reason for consult: Follow-up assessment  With this mom of a term baby, now 34 hours old, and at 9.8//////////5 weight loss at 36 hours of life. Mom did have a c-section, and the baby only lost an additional 2% in last 24 hours. I told mom we would reweigh the baby at around 11  AM to be sure she was not still losing. If weight loss still significant, mom can begin pumping and supplementing with EBm. Mom and dad agree to this plan. Baby aslep in the crib at this time - she has been cluster feeding through the night.    Maternal Data    Feeding Feeding Type: Breast Fed Length of feed: 20 min  LATCH Score/Interventions Latch: Grasps breast easily, tongue down, lips flanged, rhythmical sucking.  Audible Swallowing: A few with stimulation Intervention(s): Hand expression  Type of Nipple: Everted at rest and after stimulation  Comfort (Breast/Nipple): Soft / non-tender     Hold (Positioning): No assistance needed to correctly position infant at breast.  LATCH Score: 9  Lactation Tools Discussed/Used     Consult Status Consult Status: Follow-up Date: 01/25/16 Follow-up type: In-patient    Alfred Levins 01/25/2016, 8:48 AM

## 2016-01-25 NOTE — Progress Notes (Signed)
POSTOPERATIVE DAY # 2 S/P CS   S:         Reports feeling well - wants to go home             Tolerating po intake / no nausea / no vomiting / + flatus / no BM             Bleeding is light             Pain controlled with motrin and percocet             Up ad lib / ambulatory/ voiding QS  Newborn breast feeding    O:  VS: BP 114/54 mmHg  Pulse 69  Temp(Src) 98.1 F (36.7 C) (Oral)  Resp 18  SpO2 100%  Breastfeeding? Unknown   LABS:               Recent Labs  01/24/16 0648  WBC 11.7*  HGB 8.6*  PLT 193               Bloodtype: --/--/O POS (01/30 1000)  Rubella: Immune (07/18 0000)                                Physical Exam:             Alert and Oriented X3  Lungs: Clear and unlabored  Heart: regular rate and rhythm / no mumurs  Abdomen: soft, non-tender, non-distended, active BS             Fundus: firm, non-tender, Ueven             Dressing intact honeycomb              Incision:  approximated with staples / no erythema / no ecchymosis / no drainage  Perineum: intact  Lochia: light  Extremities: 1+ pedal edema, no calf pain or tenderness, negative Homans  A:        POD # 2 S/P CS             P:        Routine postoperative care              DC home - [facility] booklet instructions reviewed                 staple removal at day 7 next week at [facility]     Marlinda Mike CNM, MSN, [facility] 01/25/2016, 9:35 AM

## 2016-01-25 NOTE — Discharge Summary (Signed)
POSTOPERATIVE DISCHARGE SUMMARY:  Patient ID: Shelia Tucker MRN: [id number] DOB/AGE: 40-Dec-1977 39 y.o.  Admit date: 01/23/2016 Admission Diagnoses: 39 weeks / AMA / abnormal genetic screening / previous cesarean section, desires sterilization  Discharge date:  01/25/2016 Discharge Diagnoses: POD 2 s/p cesarean section / dependent edema, sterilization  Prenatal history: [id number]   EDC : 01/30/2016, by Other Basis  [facility]atal [facility] at [facility]  Primary provider : Rosia Syme Prenatal course complicated by [facility] / abnormal serum genetic screening  Prenatal Labs: ABO, Rh: --/--/O POS (01/30 1000)  Antibody: NEG (01/30 1000) Rubella: Immune (07/18 0000)   RPR: Non Reactive (01/30 1005)  HBsAg: Negative (07/18 0000)  HIV: Non-reactive (07/18 0000)  GBS: Negative (01/11 0000)   Medical / Surgical History :  Past medical history:  Past Medical History  Diagnosis Date  . 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 Trichomonas   . Condylomata acuminata in female   . H/O candidiasis   . History of varicella   . Cystitis   . GERD (gastroesophageal reflux disease)     uses Zantac when pregnant     Past surgical history:  Past Surgical History  Procedure Laterality Date  . Donated kidney  2009  . Cesarean section  05/05/2012    Procedure: CESAREAN SECTION;  Surgeon: Purcell Nails, MD;  Location: [facility] ORS;  Service: Gynecology;  Laterality: N/A;  . Cesarean section with bilateral tubal ligation Bilateral 01/23/2016    Procedure: Repeat CESAREAN SECTION WITH BILATERAL TUBAL LIGATION;  Surgeon: Maxie Better, MD;  Location: [facility] ORS;  Service: Obstetrics;  Laterality: Bilateral;  EDD: 01/30/16    Family History:  Family History  Problem Relation Age of Onset  . Hypertension Mother   . Hypertension Paternal Aunt   . Heart disease Maternal Grandmother     Social History:  reports that she has never smoked. She has never used smokeless tobacco. She reports that  she does not drink alcohol or use illicit drugs.  Allergies: Review of patient's allergies indicates no known allergies.   Current Medications at time of admission:  Prior to Admission medications   Medication Sig Start Date End Date Taking? Authorizing Provider  acetaminophen (TYLENOL) 500 MG tablet Take 500 mg by mouth every 6 (six) hours as needed for mild pain or headache.   Yes Historical Provider, MD  iron polysaccharides (NIFEREX) 150 MG capsule Take 150 mg by mouth daily.   Yes Historical Provider, MD  Prenatal Vit-Fe Fumarate-FA (PRENATAL MULTIVITAMIN) TABS tablet Take 1 tablet by mouth daily at 12 noon.   Yes Historical Provider, MD  ranitidine (ZANTAC) 150 MG tablet Take 150 mg by mouth 2 (two) times daily.   Yes Historical Provider, MD   Procedures: Cesarean section delivery on 01/23/2016 with delivery of  female newborn by Dr Cherly Hensen   See operative report for further details APGAR (1 MIN): 8   APGAR (5 MINS): 8    Postoperative / postpartum course:  Uncomplicated with discharge on POD 2  Discharge Instructions:  Discharged Condition: stable  Activity: pelvic rest and postoperative restrictions x 2   Diet: routine  Medications:    Medication List    TAKE these medications        acetaminophen 500 MG tablet  Commonly known as:  TYLENOL  Take 500 mg by mouth every 6 (six) hours as needed for mild pain or headache.     hydrochlorothiazide 25 MG tablet  Commonly known as:  HYDRODIURIL  Take 1 tablet (25 mg total) by mouth daily.     ibuprofen 600 MG tablet  Commonly known as:  ADVIL,MOTRIN  Take 1 tablet (600 mg total) by mouth every 6 (six) hours.     iron polysaccharides 150 MG capsule  Commonly known as:  NIFEREX  Take 150 mg by mouth daily.     Magnesium 200 MG Tabs  Take 1 tablet (200 mg total) by mouth daily.     Oxycodone HCl 10 MG Tabs  Take 1 tablet (10 mg total) by mouth every 4 (four) hours as needed (pain scale > 7).     prenatal  multivitamin Tabs tablet  Take 1 tablet by mouth daily at 12 noon.     ranitidine 150 MG tablet  Commonly known as:  ZANTAC  Take 150 mg by mouth 2 (two) times daily.        Wound Care: keep clean and dry / staple removal day 7 post-op at [facility] Postpartum Instructions: [facility] discharge booklet - instructions reviewed  Discharge to: Home  Follow up :  [facility] in 1week for interval visit with nurse for staple removal at [facility] in 6 weeks for routine postpartum visit with Dr Cherly Hensen                Signed: Marlinda Mike CNM, MSN, [facility] 01/25/2016, 9:39 AM

## 2016-03-31 IMAGING — US US OB TRANSVAGINAL
1 series · 13 of 28 positions shown · non-contrast
Comparison: None.

CLINICAL DATA: Pregnant, bleeding, beta HCG 58

EXAM:
OBSTETRIC <14 WK US AND TRANSVAGINAL OB US
TECHNIQUE: Both transabdominal and transvaginal ultrasound examinations were
performed for complete evaluation of the gestation as well as the
maternal uterus, adnexal regions, and pelvic cul-de-sac.
Transvaginal technique was performed to assess early pregnancy.

[Series 1: us ob comp less 14 wks · 13 of 52 slices shown]
[im 2/52]
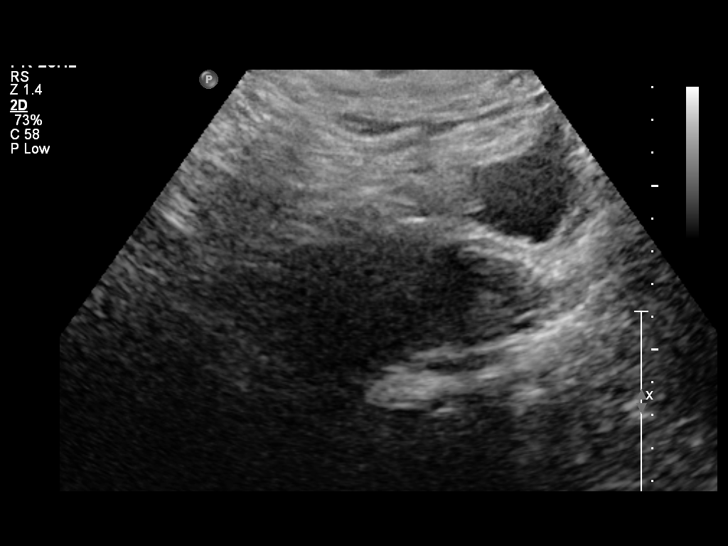
[im 6/52]
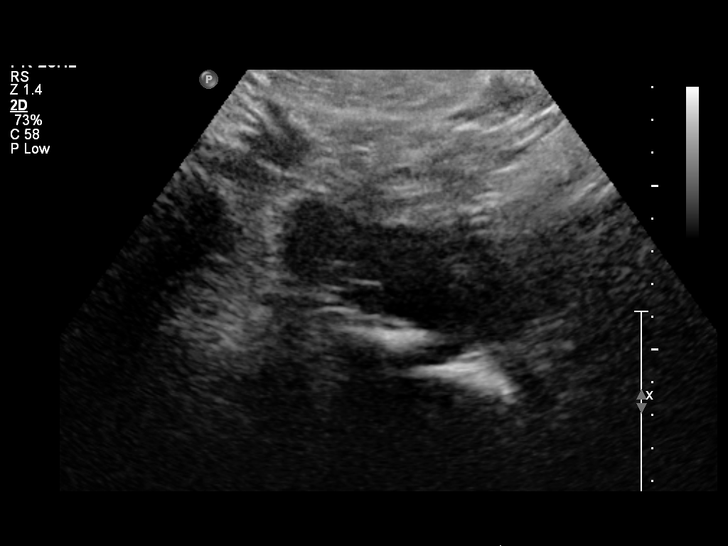
[im 10/52]
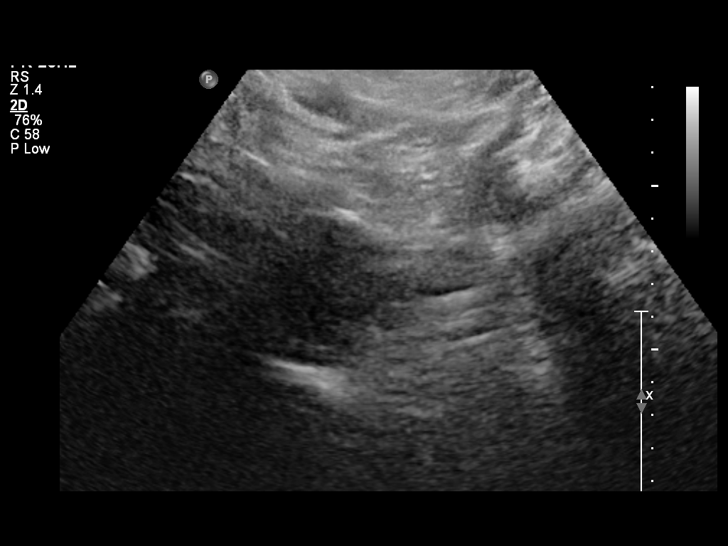
[im 14/52]
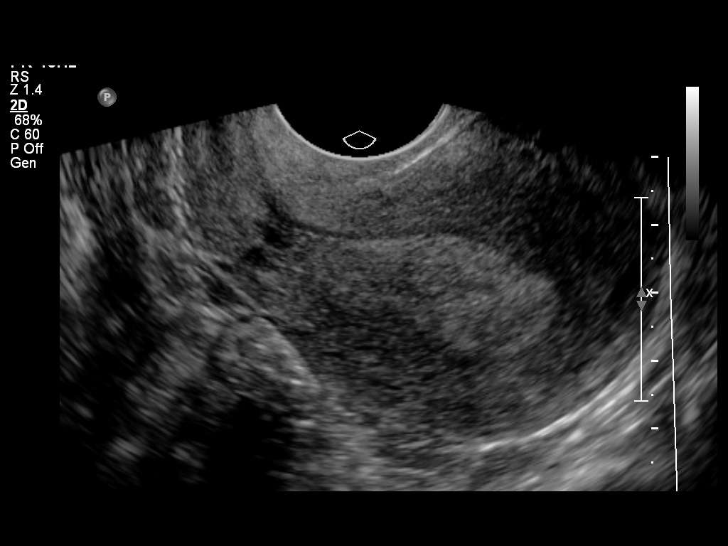
[im 18/52]
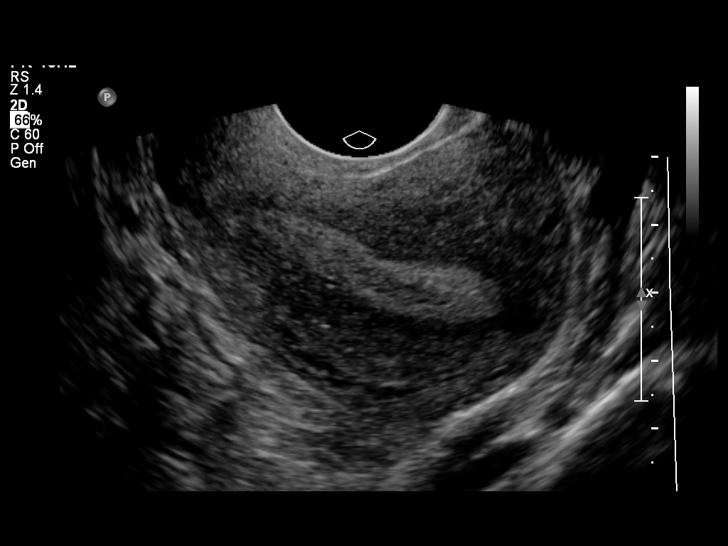
[im 21/52]
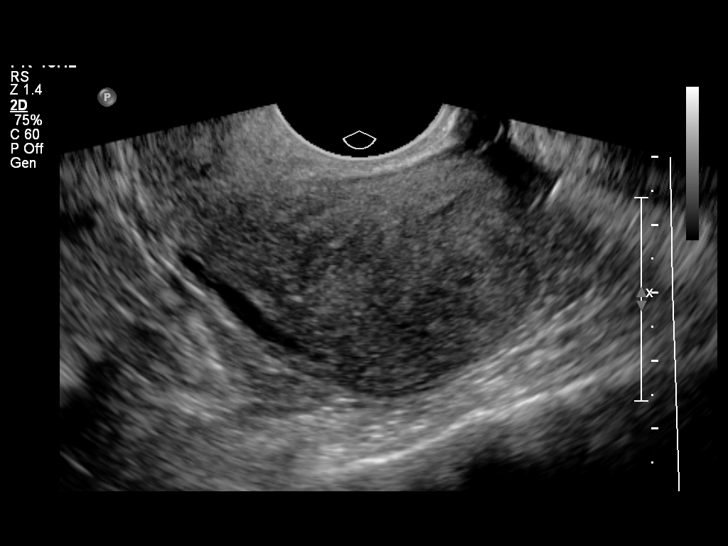
[im 27/52]
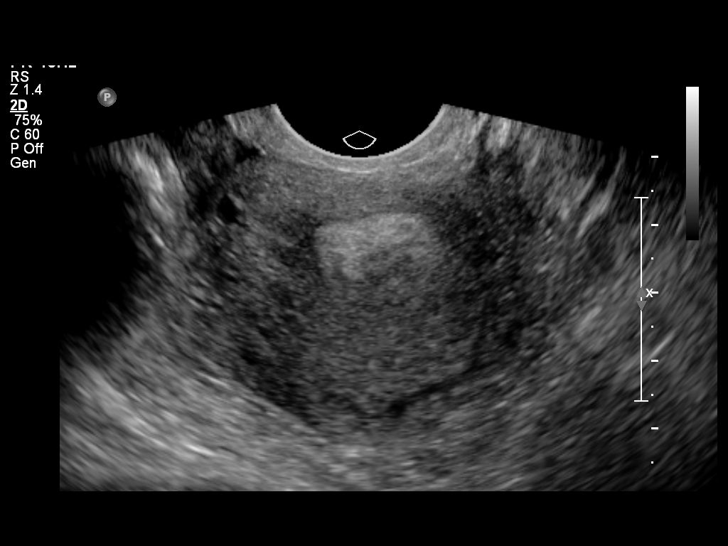
[im 31/52]
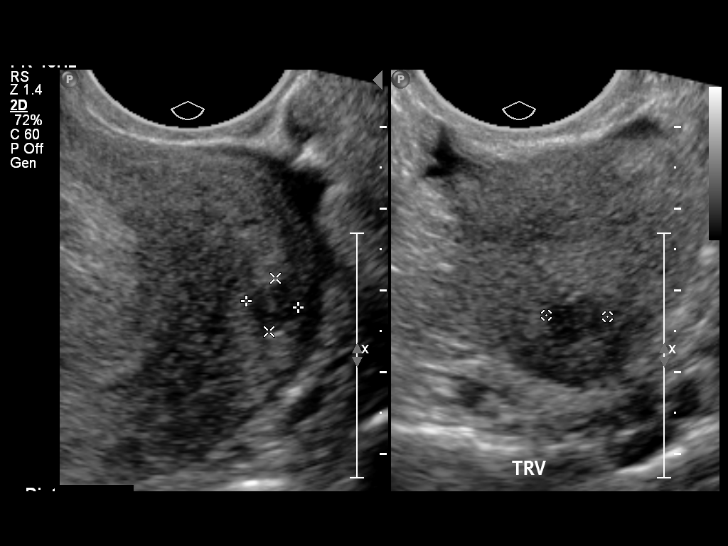
[im 35/52]
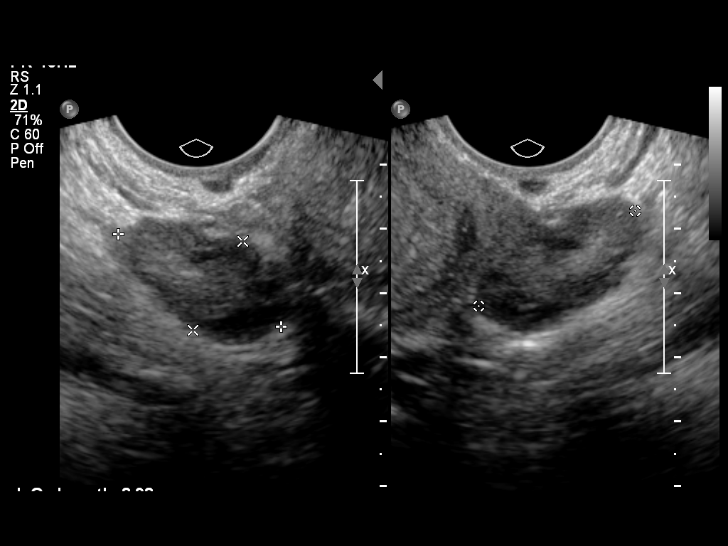
[im 38/52]
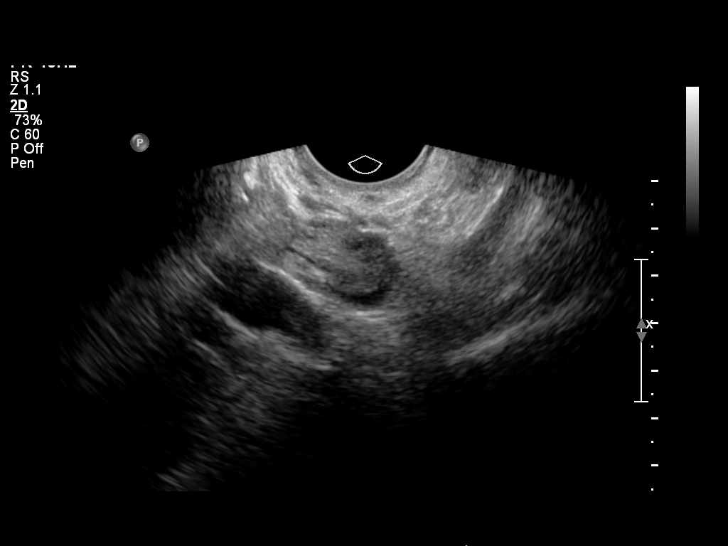
[im 42/52]
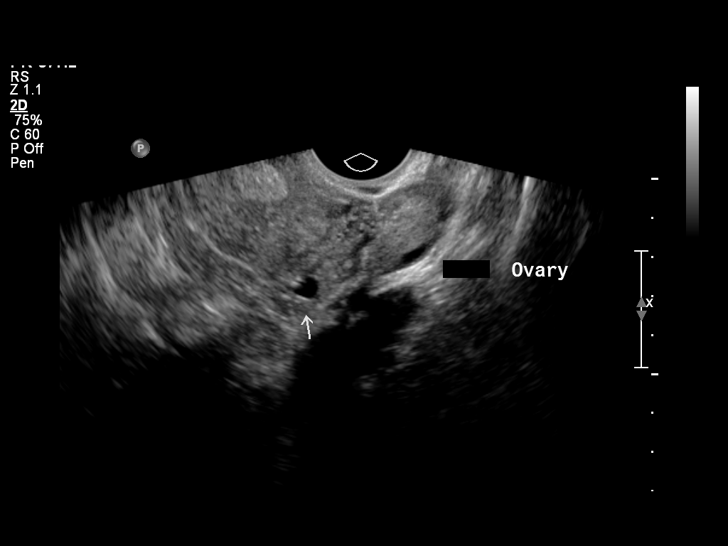
[im 46/52]
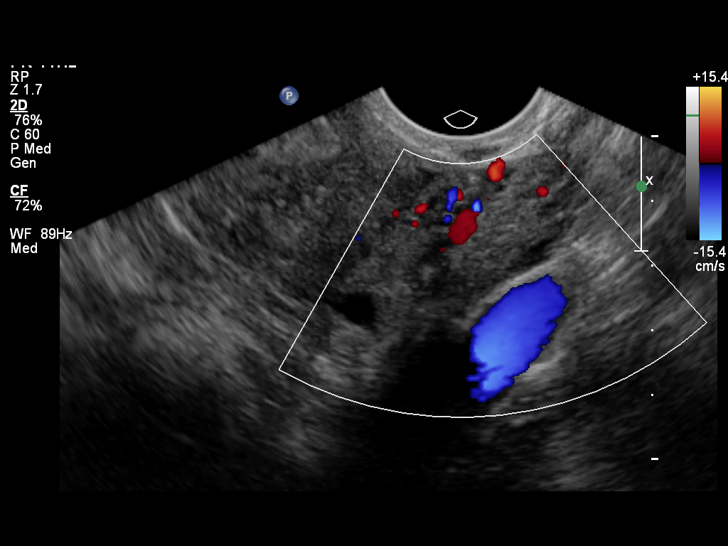
[im 50/52]
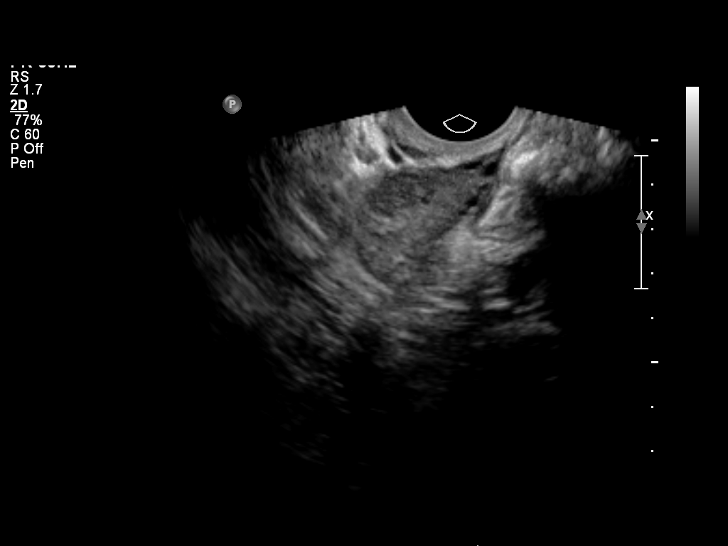

[13 of 28 positions shown; findings below may reference images not displayed]

FINDINGS: Intrauterine gestational sac: Not visualized

Maternal uterus/adnexae: Endometrial complex measures 14 mm.

Suspected 6 x 7 x 7 mm intramural fibroid in the uterine fundus.
Additional 6 x 5 x 5 mm intramural fibroid in the left uterine body.

Left ovary is within normal limits, measuring 2.9 x 1.6 x 2.9 cm.

Right ovary measures 3.6 x 1.9 x 2.8 cm and is notable for a
suspected 1.3 cm corpus luteal cyst.

Trace pelvic fluid.
IMPRESSION: No IUP is visualized.

This is not unexpected given the low beta HCG. However, by
definition, this reflects a pregnancy of unknown location.
Differential considerations include early normal IUP, abnormal
IUP/missed abortion, or nonvisualized ectopic pregnancy.

Serial beta HCG is suggested, supplemented by repeat pelvic
ultrasound in 14 days (or earlier as clinically warranted).

## 2017-04-07 ENCOUNTER — Other Ambulatory Visit: Payer: Self-pay | Admitting: Obstetrics and Gynecology

## 2017-04-07 DIAGNOSIS — Z1231 Encounter for screening mammogram for malignant neoplasm of breast: Secondary | ICD-10-CM

## 2017-04-08 ENCOUNTER — Ambulatory Visit
Admission: RE | Admit: 2017-04-08 | Discharge: 2017-04-08 | Disposition: A | Discharge status: Home / Self Care | Payer: Managed Care, Other (non HMO) | Source: Ambulatory Visit | Attending: Obstetrics and Gynecology | Admitting: Obstetrics and Gynecology

## 2017-04-08 DIAGNOSIS — Z1231 Encounter for screening mammogram for malignant neoplasm of breast: Secondary | ICD-10-CM

## 2017-05-05 IMAGING — US US MFM OB FOLLOW-UP
1 series · 14 of 28 positions shown · non-contrast
Comparison: none

[Series 1: us mfm ob follow-up · 35 acquisitions, 14 frames shown]
[im 2/35]
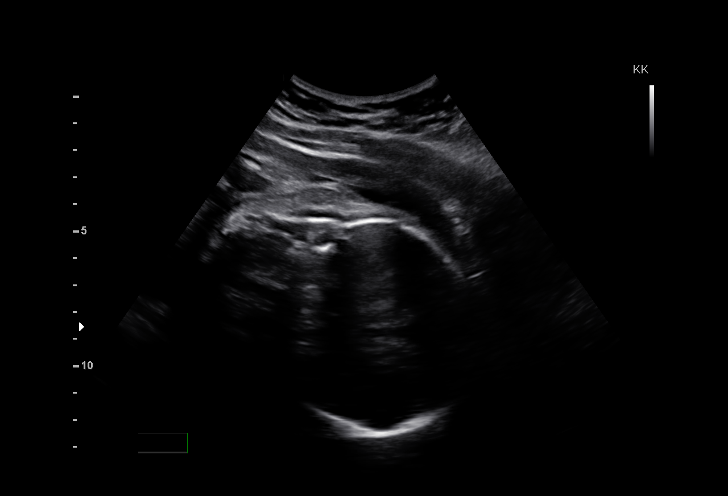
[im 4/35]
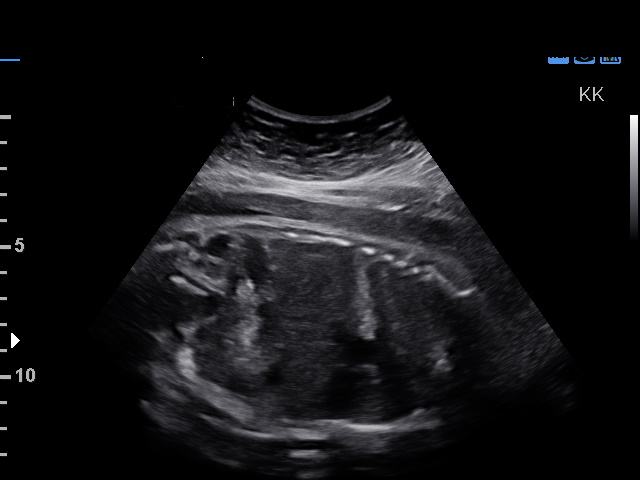
[im 7/35]
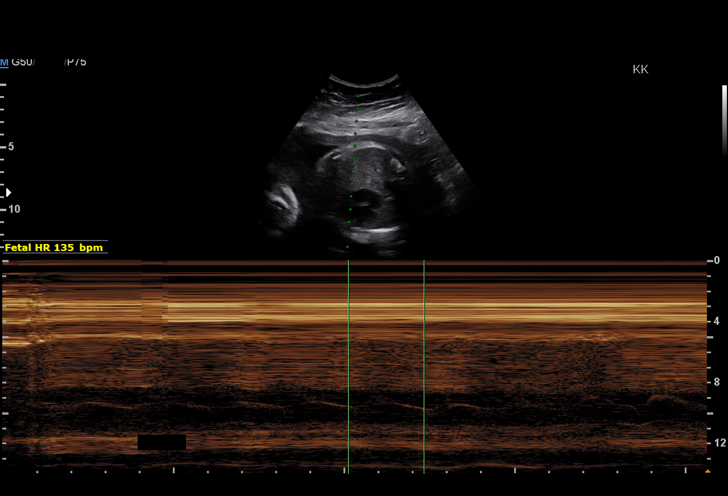
[im 9/35]
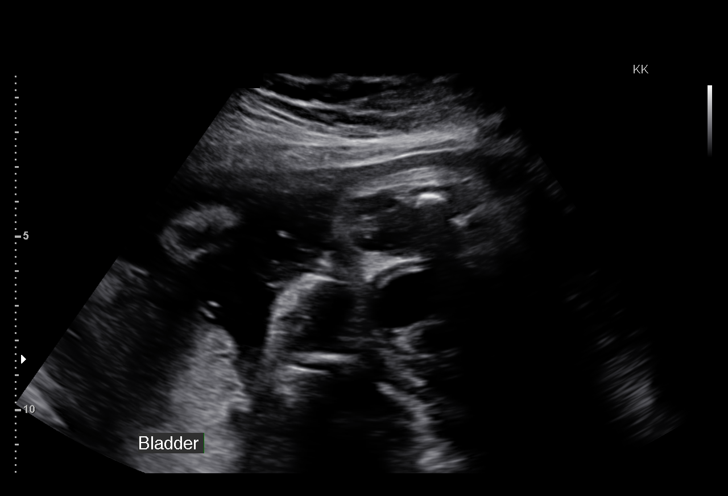
[im 12/35]
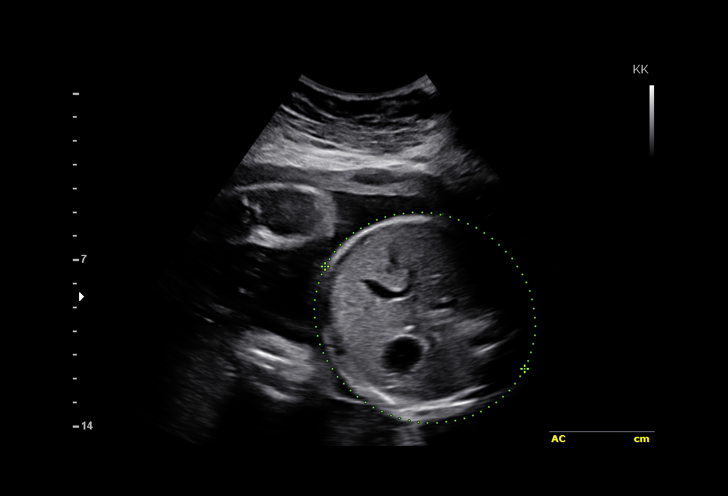
[im 14/35]
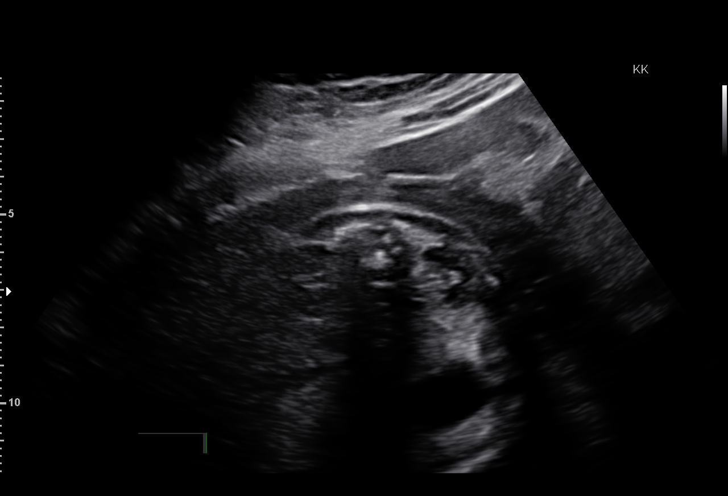
[im 17/35]
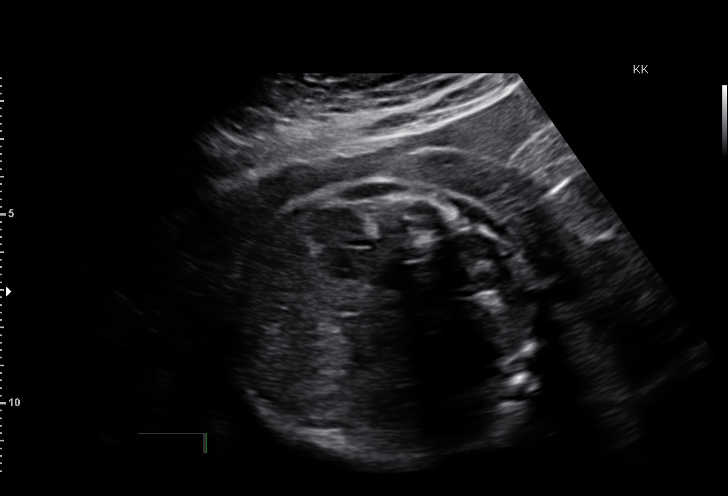
[im 19/35]
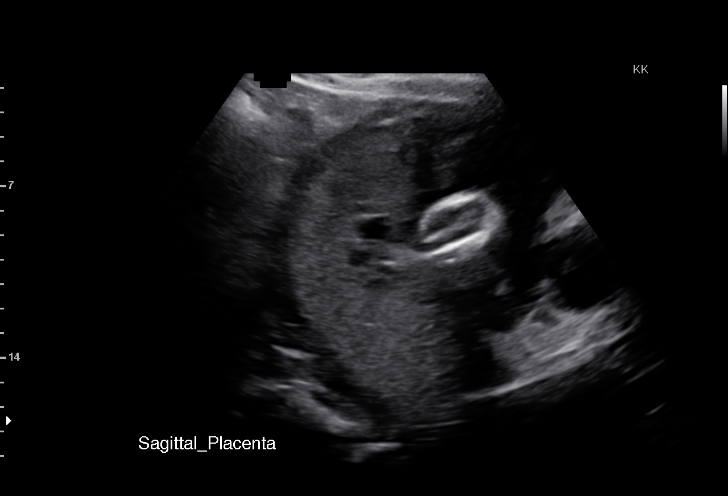
[im 22/35]
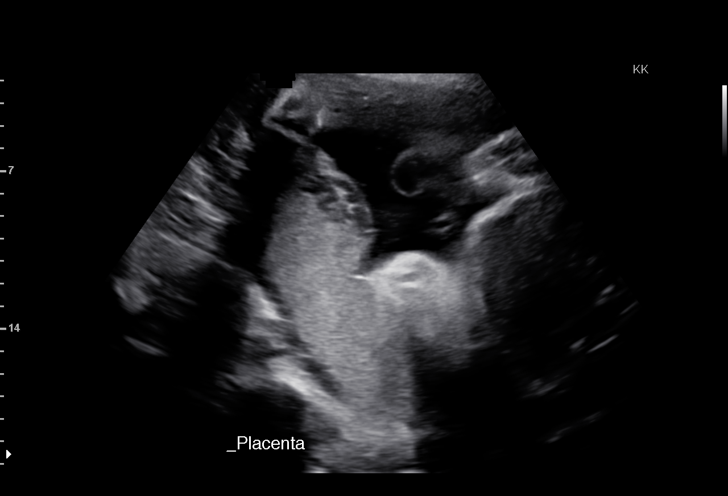
[im 24/35]
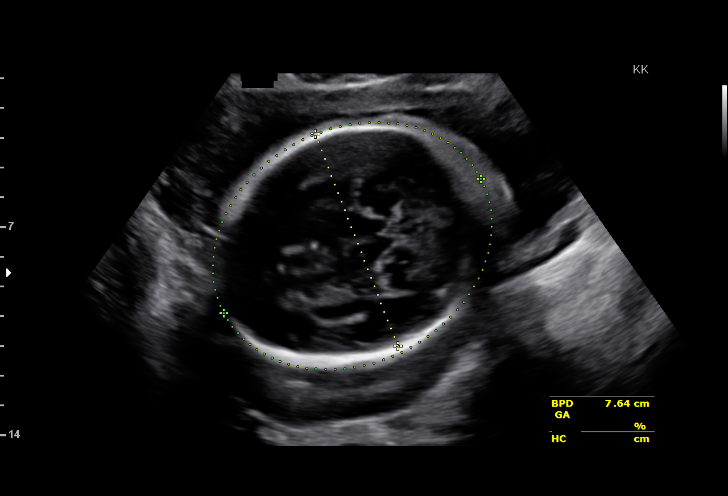
[im 27/35]
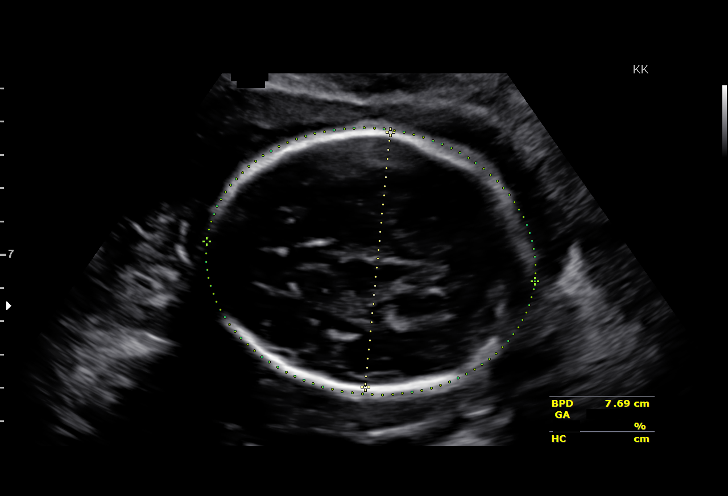
[im 29/35]
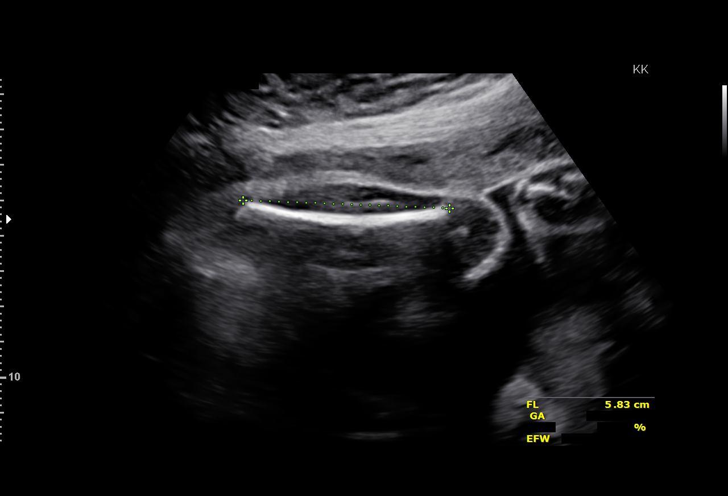
[im 32/35]
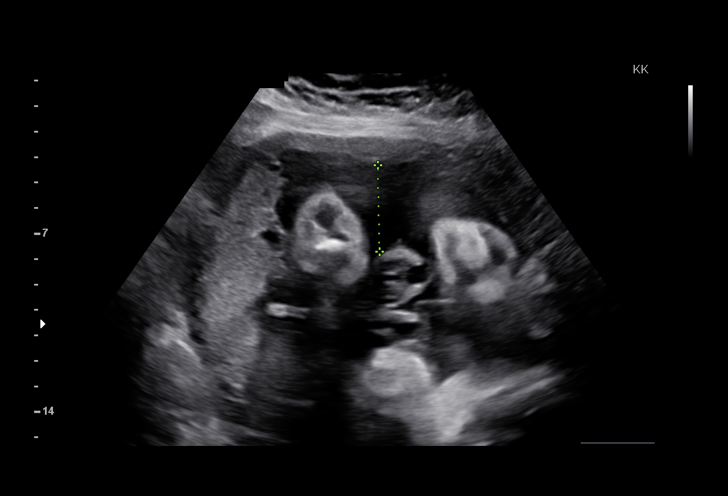
[im 35/35]
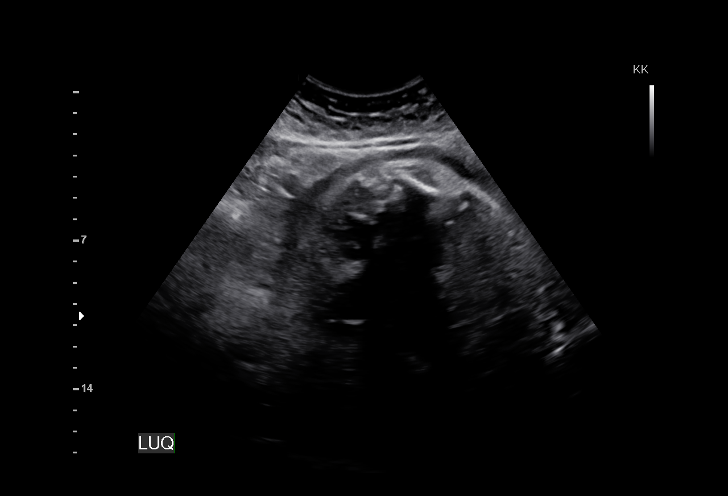

[14 of 28 positions shown; findings below may reference images not displayed]

pm)

Name:       [facility]                      Visit  11/28/2015 [DATE]
Date:

1  SERAPIO RIEHL            [id number]       [id number]     [id number]
Indications

31 weeks gestation of pregnancy
Abnormal biochemical screen (quad) T21
[DATE], T18 [DATE] - low risk NIPS
Abnormal finding on antenatal screening -
low uE3 (0.18 MoM)
Previous cesarean delivery, antepartum
Medical complication of pregnancy (kidney
donor)
Obesity complicating pregnancy, second
trimester
Advanced maternal age multigravida (39),
third trimester
OB History

Height:        5'5"   Weight:   261        BMI:
Gravidity:     4         Term:  1        Prem:    0        SAB:   2
TOP:           0       Ectopic  0        Living:  1
:
Fetal Evaluation

Num Of Fetuses:      1
Fetal Heart          135
Rate(bpm):
Cardiac Activity:    Observed
Presentation:        Cephalic
Placenta:            Posterior, above cervical os
P. Cord Insertion:   Marginal insert.at Fundus pre.

Amniotic Fluid
AFI FV:      Subjectively within normal limits
AFI Sum:     11.8     cm      29  %Tile     Larg Pckt:    4.48   cm
RUQ:   3.39    cm    LUQ:   4.48    cm   LLQ:    3.93    cm
Biometry

BPD:      76.4  mm     G. Age:   30w 5d                  CI:        74.71   %    70 - 86
FL/HC:      20.9   %    19.3 -
HC:      280.5  mm     G. Age:   30w 5d        11   %    HC/AC:      1.00        0.96 -
AC:      279.7  mm     G. Age:   32w 0d        76   %    FL/BPD      76.7   %    71 - 87
:
FL:       58.6  mm     G. Age:   30w 4d        25   %    FL/AC:      21.0   %    20 - 24

Est.        2616   gm   3 lb 14 oz      62   %
FW:
Gestational Age

U/S Today:     31w 0d                                         EDD:   01/30/16
Best:          31w 0d    Det. By:   Early Ultrasound          EDD:   01/30/16
Anatomy

Cranium:          Previously seen        Aortic Arch:       Previously seen
Fetal Cavum:      Previously seen        Ductal Arch:       Previously seen
Ventricles:       Appears normal         Diaphragm:         Previously seen
Choroid Plexus:   Previously seen        Stomach:           Appears normal,
left sided
Cerebellum:       Previously seen        Abdomen:           Previously seen
Posterior         Previously seen        Abdominal          Previously seen
Fossa:                                   Wall:
Nuchal Fold:      Previously seen        Cord Vessels:      Previously seen
Face:             Orbits and profile     Kidneys:           Appear normal
previously seen
Lips:             Previously seen        Bladder:           Appears normal
Heart:            Echogenic focus        Spine:             Previously seen
in LV prev.
RVOT:             Previously seen        Upper              Previously seen
Extremities:
LVOT:             Previously seen        Lower              Previously seen
Extremities:

Other:   Heels and 5th digit previously seen. Technically difficult due to fetal
position.
Cervix Uterus Adnexa

Cervix
Not visualized (advanced GA >93wks)
Impression

Single IUP at 31w 0d
Abnormal quad screen - low risk NIPS; low uE3
Interval fetal growth is appropriate (62nd %tile)
Posterior placenta
Normal amniotic fluid volume
Recommendations

Follow-up ultrasounds as clinically indicated.
